# Patient Record
Sex: Male | Born: 1960 | Race: White | Hispanic: No | Marital: Single | Smoking: Never smoker
Health system: Southern US, Community
[De-identification: ages and names within clinical notes are randomized; demographics above are authoritative.]

## PROBLEM LIST (undated history)

## (undated) DIAGNOSIS — S36119A Unspecified injury of liver, initial encounter: Secondary | ICD-10-CM

## (undated) HISTORY — PX: CYST REMOVAL TRUNK: SHX6283

## (undated) HISTORY — PX: OTHER SURGICAL HISTORY: SHX169

---
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
## 2008-03-01 ENCOUNTER — Encounter: Payer: Self-pay | Admitting: Family Medicine

## 2009-03-22 ENCOUNTER — Telehealth (INDEPENDENT_AMBULATORY_CARE_PROVIDER_SITE_OTHER): Payer: Self-pay | Admitting: *Deleted

## 2009-03-22 ENCOUNTER — Ambulatory Visit: Payer: Self-pay | Admitting: Family Medicine

## 2009-03-22 DIAGNOSIS — F17200 Nicotine dependence, unspecified, uncomplicated: Secondary | ICD-10-CM | POA: Insufficient documentation

## 2009-03-22 DIAGNOSIS — R42 Dizziness and giddiness: Secondary | ICD-10-CM | POA: Insufficient documentation

## 2009-03-22 DIAGNOSIS — E785 Hyperlipidemia, unspecified: Secondary | ICD-10-CM | POA: Insufficient documentation

## 2009-03-22 DIAGNOSIS — K219 Gastro-esophageal reflux disease without esophagitis: Secondary | ICD-10-CM | POA: Insufficient documentation

## 2009-03-22 DIAGNOSIS — R21 Rash and other nonspecific skin eruption: Secondary | ICD-10-CM | POA: Insufficient documentation

## 2009-03-27 LAB — CONVERTED CEMR LAB
ALT: 22 units/L (ref 0–53)
AST: 28 units/L (ref 0–37)
Albumin: 4.5 g/dL (ref 3.5–5.2)
Alkaline Phosphatase: 58 units/L (ref 39–117)
BUN: 16 mg/dL (ref 6–23)
Basophils Relative: 0.5 % (ref 0.0–3.0)
CO2: 31 meq/L (ref 19–32)
Eosinophils Absolute: 0 10*3/uL (ref 0.0–0.7)
Glucose, Bld: 84 mg/dL (ref 70–99)
HCT: 45.2 % (ref 39.0–52.0)
Hemoglobin: 15.2 g/dL (ref 13.0–17.0)
Lymphocytes Relative: 23.8 % (ref 12.0–46.0)
Lymphs Abs: 1.7 10*3/uL (ref 0.7–4.0)
MCHC: 33.7 g/dL (ref 30.0–36.0)
MCV: 91.6 fL (ref 78.0–100.0)
Neutro Abs: 4.8 10*3/uL (ref 1.4–7.7)
Potassium: 4.7 meq/L (ref 3.5–5.1)
RBC: 4.93 M/uL (ref 4.22–5.81)
Sodium: 141 meq/L (ref 135–145)
Total Protein: 8 g/dL (ref 6.0–8.3)
VLDL: 17 mg/dL (ref 0.0–40.0)

## 2014-04-03 ENCOUNTER — Ambulatory Visit: Payer: Self-pay | Attending: Internal Medicine

## 2014-04-20 ENCOUNTER — Encounter: Payer: Self-pay | Admitting: Internal Medicine

## 2014-04-20 ENCOUNTER — Ambulatory Visit: Payer: Self-pay | Attending: Internal Medicine | Admitting: Internal Medicine

## 2014-04-20 VITALS — BP 128/78 | HR 95 | Temp 98.0°F | Resp 16 | Wt 199.6 lb

## 2014-04-20 DIAGNOSIS — K219 Gastro-esophageal reflux disease without esophagitis: Secondary | ICD-10-CM | POA: Insufficient documentation

## 2014-04-20 DIAGNOSIS — M25519 Pain in unspecified shoulder: Secondary | ICD-10-CM | POA: Insufficient documentation

## 2014-04-20 DIAGNOSIS — Z139 Encounter for screening, unspecified: Secondary | ICD-10-CM

## 2014-04-20 DIAGNOSIS — M25512 Pain in left shoulder: Secondary | ICD-10-CM | POA: Insufficient documentation

## 2014-04-20 DIAGNOSIS — K429 Umbilical hernia without obstruction or gangrene: Secondary | ICD-10-CM | POA: Insufficient documentation

## 2014-04-20 LAB — CBC WITH DIFFERENTIAL/PLATELET
BASOS PCT: 0 % (ref 0–1)
Basophils Absolute: 0 10*3/uL (ref 0.0–0.1)
EOS ABS: 0.1 10*3/uL (ref 0.0–0.7)
Eosinophils Relative: 2 % (ref 0–5)
HCT: 43.8 % (ref 39.0–52.0)
HEMOGLOBIN: 15.7 g/dL (ref 13.0–17.0)
Lymphocytes Relative: 27 % (ref 12–46)
Lymphs Abs: 2 10*3/uL (ref 0.7–4.0)
MCH: 30 pg (ref 26.0–34.0)
MCHC: 35.8 g/dL (ref 30.0–36.0)
MCV: 83.6 fL (ref 78.0–100.0)
MONOS PCT: 9 % (ref 3–12)
Monocytes Absolute: 0.7 10*3/uL (ref 0.1–1.0)
NEUTROS ABS: 4.5 10*3/uL (ref 1.7–7.7)
NEUTROS PCT: 62 % (ref 43–77)
PLATELETS: 300 10*3/uL (ref 150–400)
RBC: 5.24 MIL/uL (ref 4.22–5.81)
RDW: 14.2 % (ref 11.5–15.5)
WBC: 7.3 10*3/uL (ref 4.0–10.5)

## 2014-04-20 MED ORDER — OMEPRAZOLE 40 MG PO CPDR: 40.0000 mg | DELAYED_RELEASE_CAPSULE | Freq: Every day | ORAL | Status: DC

## 2014-04-20 MED ORDER — IBUPROFEN 600 MG PO TABS: 600.0000 mg | ORAL_TABLET | Freq: Three times a day (TID) | ORAL | Status: DC | PRN

## 2014-04-20 MED ORDER — TRAMADOL HCL 50 MG PO TABS: 50.0000 mg | ORAL_TABLET | Freq: Three times a day (TID) | ORAL | Status: DC | PRN

## 2014-04-20 NOTE — Progress Notes (Signed)
Patient Demographics  Jeffrey [name], is a 53 y.o. male  JYN:[id number]:[id number]  QMV:[id number]  DOB - 02/07/1961  CC:  Chief Complaint  Patient presents with  . Establish Care       HPI: Jeffrey [name] is a 53 y.o. male here today to establish medical care. Patient reported to have left shoulder pain for the last 6 months, denies any fall or trauma as per patient he was stretching and then he felt a popping sound, patient has been taking over-the-counter pain medication has difficulty in moving his left arm above the shoulder level, he also reported to have a umbilical hernia for several years, as per patient it slightly increased in size and wants to be fixed, denies any nausea vomiting reported to have GERD symptoms and he tried over-the-counter comes patient does drink alcohol. Patient has No headache, No chest pain, No abdominal pain - No Nausea, No new weakness tingling or numbness, No Cough - SOB.  No Known Allergies History reviewed. No pertinent past medical history. No current outpatient prescriptions on file prior to visit.   No current facility-administered medications on file prior to visit.   History reviewed. No pertinent family history. History   Social History  . Marital Status: Single    Spouse Name: N/A    Number of Children: N/A  . Years of Education: N/A   Occupational History  . Not on file.   Social History Main Topics  . Smoking status: Never Smoker   . Smokeless tobacco: Not on file  . Alcohol Use: Yes     Comment: occasioonally   . Drug Use: No     Comment: last use was 20 years ago   . Sexual Activity: Not on file   Other Topics Concern  . Not on file   Social History Narrative  . No narrative on file    Review of Systems: Constitutional: Negative for fever, chills, diaphoresis, activity change, appetite change and fatigue. HENT: Negative for ear pain, nosebleeds, congestion, facial swelling, rhinorrhea, neck pain, neck stiffness and ear  discharge.  Eyes: Negative for pain, discharge, redness, itching and visual disturbance. Respiratory: Negative for cough, choking, chest tightness, shortness of breath, wheezing and stridor.  Cardiovascular: Negative for chest pain, palpitations and leg swelling. Gastrointestinal: Negative for abdominal distention. Genitourinary: Negative for dysuria, urgency, frequency, hematuria, flank pain, decreased urine volume, difficulty urinating and dyspareunia.  Musculoskeletal: Negative for back pain, joint swelling, arthralgia and gait problem. Neurological: Negative for dizziness, tremors, seizures, syncope, facial asymmetry, speech difficulty, weakness, light-headedness, numbness and headaches.  Hematological: Negative for adenopathy. Does not bruise/bleed easily. Psychiatric/Behavioral: Negative for hallucinations, behavioral problems, confusion, dysphoric mood, decreased concentration and agitation.    Objective:   Filed Vitals:   04/20/14 1544  BP: 128/78  Pulse: 95  Temp: 98 F (36.7 C)  Resp: 16    Physical Exam: Constitutional: Patient appears well-developed and well-nourished. No distress. HENT: Normocephalic, atraumatic, External right and left ear normal. Oropharynx is clear and moist.  Eyes: Conjunctivae and EOM are normal. PERRLA, no scleral icterus. Neck: Normal ROM. Neck supple. No JVD. No tracheal deviation. No thyromegaly. CVS: RRR, S1/S2 +, no murmurs, no gallops, no carotid bruit.  Pulmonary: Effort and breath sounds normal, no stridor, rhonchi, wheezes, rales.  Abdominal: Soft. BS +, no distension, tenderness, rebound or guarding. : Umbilical hernia.  Musculoskeletal: Left shoulder limitation in full range of motion, patient cannot abduct fully.  axillary Neuro: Alert. Normal reflexes, muscle tone coordination. No  cranial nerve deficit. Skin: Skin is warm and dry. No rash noted. Not diaphoretic. No erythema. No pallor. Psychiatric: Normal mood and affect. Behavior,  judgment, thought content normal.  Lab Results  Component Value Date   WBC 7.1 03/22/2009   HGB 15.2 03/22/2009   HCT 45.2 03/22/2009   MCV 91.6 03/22/2009   PLT 256.0 03/22/2009   Lab Results  Component Value Date   CREATININE 0.8 03/22/2009   BUN 16 03/22/2009   NA 141 03/22/2009   K 4.7 03/22/2009   CL 104 03/22/2009   CO2 31 03/22/2009    No results found for this basename: HGBA1C   Lipid Panel     Component Value Date/Time   CHOL 266* 03/22/2009 1050   TRIG 85.0 03/22/2009 1050   HDL 46.00 03/22/2009 1050   CHOLHDL 6 03/22/2009 1050   VLDL 17.0 03/22/2009 1050       Assessment and plan:   1. Umbilical hernia  - Ambulatory referral to General Surgery  2. Screening Ordered baseline blood work - CBC with Differential - COMPLETE METABOLIC PANEL WITH GFR - TSH - Lipid panel - Vit D  25 hydroxy (rtn osteoporosis monitoring)  3. GERD (gastroesophageal reflux disease) Advised patient for lifestyle modification, trial of Prilosec - omeprazole (PRILOSEC) 40 MG capsule; Take 1 capsule (40 mg total) by mouth daily.  Dispense: 30 capsule; Refill: 3  4. Left shoulder pain Will get MRI to evaluate for soft tissue injury. Prescribed ibuprofen and tramadol for severe pain. - MR Shoulder Left Wo Contrast; Future - traMADol (ULTRAM) 50 MG tablet; Take 1 tablet (50 mg total) by mouth every 8 (eight) hours as needed for moderate pain.  Dispense: 30 tablet; Refill: 0 - ibuprofen (ADVIL,MOTRIN) 600 MG tablet; Take 1 tablet (600 mg total) by mouth every 8 (eight) hours as needed.  Dispense: 60 tablet; Refill: 1  Return in about 3 months (around 07/21/2014) for shoulder pain.  Doris CheadleADVANI, Gentri Guardado, MD

## 2014-04-20 NOTE — Progress Notes (Signed)
Patient here to establish care Complains of bumps on bottom of his feet Scoliosis Hernia Left rotator cuff injury

## 2014-04-21 LAB — TSH: TSH: 1.62 u[IU]/mL (ref 0.350–4.500)

## 2014-04-21 LAB — COMPLETE METABOLIC PANEL WITH GFR
ALBUMIN: 4.7 g/dL (ref 3.5–5.2)
ALK PHOS: 63 U/L (ref 39–117)
ALT: 15 U/L (ref 0–53)
AST: 18 U/L (ref 0–37)
BILIRUBIN TOTAL: 0.6 mg/dL (ref 0.2–1.2)
BUN: 10 mg/dL (ref 6–23)
CO2: 26 mEq/L (ref 19–32)
Calcium: 9.5 mg/dL (ref 8.4–10.5)
Chloride: 102 mEq/L (ref 96–112)
Creat: 1.02 mg/dL (ref 0.50–1.35)
GFR, Est African American: >89 mL/min
GFR, Est Non African American: 85 mL/min
GLUCOSE: 84 mg/dL (ref 70–99)
POTASSIUM: 4.4 meq/L (ref 3.5–5.3)
SODIUM: 139 meq/L (ref 135–145)
TOTAL PROTEIN: 7.8 g/dL (ref 6.0–8.3)

## 2014-04-21 LAB — LIPID PANEL
CHOL/HDL RATIO: 7.7 ratio
Cholesterol: 294 mg/dL — ABNORMAL HIGH (ref 0–200)
HDL: 38 mg/dL — AB (ref >39)
LDL CALC: 209 mg/dL — AB (ref 0–99)
TRIGLYCERIDES: 234 mg/dL — AB (ref <150)
VLDL: 47 mg/dL — AB (ref 0–40)

## 2014-04-21 LAB — VITAMIN D 25 HYDROXY (VIT D DEFICIENCY, FRACTURES): VIT D 25 HYDROXY: 35 ng/mL (ref 30–89)

## 2014-04-23 ENCOUNTER — Telehealth: Payer: Self-pay

## 2014-04-23 MED ORDER — ATORVASTATIN CALCIUM 20 MG PO TABS: 20.0000 mg | ORAL_TABLET | Freq: Every day | ORAL | Status: DC

## 2014-04-23 NOTE — Telephone Encounter (Signed)
Patient not available Left message on voice mail  To return our call

## 2014-04-23 NOTE — Telephone Encounter (Signed)
Message copied by Lestine MountJUAREZ, Roselia Snipe L on Mon Apr 23, 2014  2:12 PM ------      Message from: Doris CheadleADVANI, DEEPAK      Created: Mon Apr 23, 2014  9:17 AM       Blood work reviewed, noticed high cholesterol level including bad cholesterol ( LDL is more than 200), advise patient for low fat diet and start taking Lipitor 20 mg daily, will repeat lipid panel in 3 months. ------

## 2014-05-14 ENCOUNTER — Telehealth: Payer: Self-pay | Admitting: Internal Medicine

## 2014-05-14 ENCOUNTER — Ambulatory Visit (HOSPITAL_COMMUNITY)
Admission: RE | Admit: 2014-05-14 | Discharge: 2014-05-14 | Disposition: A | Discharge status: Home / Self Care | Payer: Self-pay | Source: Ambulatory Visit | Attending: Internal Medicine | Admitting: Internal Medicine

## 2014-05-14 DIAGNOSIS — M75100 Unspecified rotator cuff tear or rupture of unspecified shoulder, not specified as traumatic: Secondary | ICD-10-CM | POA: Insufficient documentation

## 2014-05-14 DIAGNOSIS — M25512 Pain in left shoulder: Secondary | ICD-10-CM

## 2014-05-14 DIAGNOSIS — IMO0002 Reserved for concepts with insufficient information to code with codable children: Secondary | ICD-10-CM | POA: Insufficient documentation

## 2014-05-14 NOTE — Telephone Encounter (Signed)
Pt. Needs to speak to nurse in regards to blood work, patient was informed that medication was ready for him to pick up, but he would rather talk to a nurse.

## 2014-05-15 ENCOUNTER — Telehealth: Payer: Self-pay

## 2014-05-15 NOTE — Telephone Encounter (Signed)
Patient not available Left message on voice mail to return our call 

## 2014-05-15 NOTE — Telephone Encounter (Signed)
Message copied by Lestine MountJUAREZ, Lachandra Dettmann L on Tue May 15, 2014  2:33 PM ------      Message from: Doris CheadleADVANI, DEEPAK      Created: Mon May 14, 2014  3:40 PM       Call and let the patient know that his MRI left shoulder reported rotator cuff tendinosis without tear, AC joint mild DJD/bursitis, patient needs to see sports medicine for further management. Please put in the referral. ------

## 2014-05-15 NOTE — Telephone Encounter (Signed)
Returned patient phone call Patient not available Left message on voice mail to return our call 

## 2014-05-17 ENCOUNTER — Telehealth: Payer: Self-pay | Admitting: *Deleted

## 2014-05-17 DIAGNOSIS — M25512 Pain in left shoulder: Secondary | ICD-10-CM

## 2014-05-17 NOTE — Telephone Encounter (Signed)
Patient called regarding results of MRI. Results of MRI and previous lab worked discussed at length. Patient advised on healthy diet and exercise to lower cholesterol. Patient states understanding. Also advised to start Lipitor as directed. Patient states he will pick up Lipitor and f/u in 3 months as instructed. Referral to sports med placed

## 2015-04-29 ENCOUNTER — Encounter (HOSPITAL_COMMUNITY): Payer: Self-pay | Admitting: Emergency Medicine

## 2015-04-29 ENCOUNTER — Emergency Department (HOSPITAL_COMMUNITY)
Admission: EM | Admit: 2015-04-29 | Discharge: 2015-04-29 | Disposition: A | Discharge status: Home / Self Care | Payer: Self-pay | Attending: Emergency Medicine | Admitting: Emergency Medicine

## 2015-04-29 DIAGNOSIS — Z8781 Personal history of (healed) traumatic fracture: Secondary | ICD-10-CM | POA: Insufficient documentation

## 2015-04-29 DIAGNOSIS — R61 Generalized hyperhidrosis: Secondary | ICD-10-CM | POA: Insufficient documentation

## 2015-04-29 DIAGNOSIS — X58XXXA Exposure to other specified factors, initial encounter: Secondary | ICD-10-CM | POA: Insufficient documentation

## 2015-04-29 DIAGNOSIS — E860 Dehydration: Secondary | ICD-10-CM | POA: Insufficient documentation

## 2015-04-29 DIAGNOSIS — R55 Syncope and collapse: Secondary | ICD-10-CM | POA: Insufficient documentation

## 2015-04-29 DIAGNOSIS — Z79899 Other long term (current) drug therapy: Secondary | ICD-10-CM | POA: Insufficient documentation

## 2015-04-29 DIAGNOSIS — R42 Dizziness and giddiness: Secondary | ICD-10-CM | POA: Insufficient documentation

## 2015-04-29 DIAGNOSIS — Y9353 Activity, golf: Secondary | ICD-10-CM | POA: Insufficient documentation

## 2015-04-29 DIAGNOSIS — S0101XA Laceration without foreign body of scalp, initial encounter: Secondary | ICD-10-CM | POA: Insufficient documentation

## 2015-04-29 DIAGNOSIS — S0003XA Contusion of scalp, initial encounter: Secondary | ICD-10-CM

## 2015-04-29 DIAGNOSIS — Y9229 Other specified public building as the place of occurrence of the external cause: Secondary | ICD-10-CM | POA: Insufficient documentation

## 2015-04-29 DIAGNOSIS — Y998 Other external cause status: Secondary | ICD-10-CM | POA: Insufficient documentation

## 2015-04-29 LAB — COMPREHENSIVE METABOLIC PANEL
ALBUMIN: 3.6 g/dL (ref 3.5–5.0)
ALK PHOS: 62 U/L (ref 38–126)
ALT: 35 U/L (ref 17–63)
AST: 35 U/L (ref 15–41)
Anion gap: 12 (ref 5–15)
BUN: 10 mg/dL (ref 6–20)
CO2: 20 mmol/L — ABNORMAL LOW (ref 22–32)
Calcium: 8.4 mg/dL — ABNORMAL LOW (ref 8.9–10.3)
Chloride: 108 mmol/L (ref 101–111)
Creatinine, Ser: 1.3 mg/dL — ABNORMAL HIGH (ref 0.61–1.24)
GFR calc Af Amer: >60 mL/min (ref >60)
GFR calc non Af Amer: >60 mL/min (ref >60)
GLUCOSE: 94 mg/dL (ref 65–99)
POTASSIUM: 3.2 mmol/L — AB (ref 3.5–5.1)
Sodium: 140 mmol/L (ref 135–145)
TOTAL PROTEIN: 6.8 g/dL (ref 6.5–8.1)
Total Bilirubin: 0.6 mg/dL (ref 0.3–1.2)

## 2015-04-29 LAB — CBC
HCT: 41.1 % (ref 39.0–52.0)
HEMOGLOBIN: 14.5 g/dL (ref 13.0–17.0)
MCH: 30.3 pg (ref 26.0–34.0)
MCHC: 35.3 g/dL (ref 30.0–36.0)
MCV: 86 fL (ref 78.0–100.0)
Platelets: 233 10*3/uL (ref 150–400)
RBC: 4.78 MIL/uL (ref 4.22–5.81)
RDW: 12.2 % (ref 11.5–15.5)
WBC: 7.6 10*3/uL (ref 4.0–10.5)

## 2015-04-29 LAB — I-STAT TROPONIN, ED: TROPONIN I, POC: 0 ng/mL (ref 0.00–0.08)

## 2015-04-29 LAB — CBG MONITORING, ED: Glucose-Capillary: 86 mg/dL (ref 65–99)

## 2015-04-29 LAB — ETHANOL: Alcohol, Ethyl (B): 15 mg/dL — ABNORMAL HIGH (ref <5)

## 2015-04-29 MED ORDER — SODIUM CHLORIDE 0.9 % IV BOLUS (SEPSIS)
1000.0000 mL | Freq: Once | INTRAVENOUS | Status: AC
  Administered 2015-04-29: 1000 mL via INTRAVENOUS

## 2015-04-29 NOTE — ED Notes (Signed)
Pt presents by EMS for syncopal episode that was [place] golf club[place]; pt reports round of golf and drinking etoh today without eating food; pt reports minimal PO hydration other than etoh; pt reports feeling dizzy and diaphoretic; witnesses report syncopal where pt fell and struck posterior head on lightly carpeted concrete floor; pt CAOx4 at this time; hematoma with bleeding noted to posterior head

## 2015-04-29 NOTE — ED Notes (Signed)
Pt ambulated in hallway without difficulty

## 2015-04-29 NOTE — ED Provider Notes (Signed)
CSN: [id number]     Arrival date & time 04/29/15  1943 History   First MD Initiated Contact with Patient 04/29/15 1957     Chief Complaint  Patient presents with  . Loss of Consciousness  . Head Injury    (Consider location/radiation/quality/duration/timing/severity/associated sxs/prior Treatment) HPI Comments: 54 year old male with no significant past medical history presents to the emergency department for further evaluation of a syncopal event. Patient reports that he had a [facility] for breakfast and 3 beers while golfing 9 holes today. He denies eating any food since waking. He was out in the sun for the majority of the day. He states that he began to feel lightheaded when he completed his round of golf. He went to the car and turned it on too cool himself with the [facility] unit. Patient reports that he next wanted to go inside the golf club house as he thought this would help him more than sitting in his car. He states that he remembers reaching for the door and, the next thing he knew, he was on the floor being helped by 2 individuals. Patient denies any chest pain or shortness of breath currently or prior to his syncopal event. He further denies headache, vision loss, hearing changes, nausea, vomiting, or extremity numbness/weakness. Patient cannot recall the date of his last tetanus shot. He states, "I want y'all to just tell me that I'm fine and can go home now".  Patient is a 54 y.o. male presenting with syncope and head injury. The history is provided by the patient. No language interpreter was used.  Loss of Consciousness Associated symptoms: diaphoresis   Associated symptoms: no chest pain, no fever, no headaches, no nausea, no shortness of breath and no vomiting   Head Injury Associated symptoms: no headaches, no nausea and no vomiting     History reviewed. No pertinent past medical history. Past Surgical History  Procedure Laterality Date  . Collar bone fracture     History  reviewed. No pertinent family history. History  Substance Use Topics  . Smoking status: Never Smoker   . Smokeless tobacco: Not on file  . Alcohol Use: Yes     Comment: occasioonally     Review of Systems  Constitutional: Positive for diaphoresis. Negative for fever.  Respiratory: Negative for shortness of breath.   Cardiovascular: Positive for syncope. Negative for chest pain.  Gastrointestinal: Negative for nausea and vomiting.  Neurological: Positive for syncope and light-headedness. Negative for headaches.  All other systems reviewed and are negative.   Allergies  Review of patient's allergies indicates no known allergies.  Home Medications   Prior to Admission medications   Medication Sig Start Date End Date Taking? Authorizing Provider  atorvastatin (LIPITOR) 20 MG tablet Take 1 tablet (20 mg total) by mouth daily. 04/23/14   Doris Cheadle, MD  ibuprofen (ADVIL,MOTRIN) 600 MG tablet Take 1 tablet (600 mg total) by mouth every 8 (eight) hours as needed. 04/20/14   Doris Cheadle, MD  omeprazole (PRILOSEC) 40 MG capsule Take 1 capsule (40 mg total) by mouth daily. 04/20/14   Doris Cheadle, MD  traMADol (ULTRAM) 50 MG tablet Take 1 tablet (50 mg total) by mouth every 8 (eight) hours as needed for moderate pain. 04/20/14   Doris Cheadle, MD   BP 124/94 mmHg  Pulse 78  Temp(Src) 97.6 F (36.4 C) (Oral)  Resp 19  Ht 5\' 11"  (1.803 m)  Wt 185 lb (83.915 kg)  BMI 25.81 kg/m2  SpO2 100%  Physical Exam  Constitutional: He is oriented to person, place, and time. He appears well-developed and well-nourished. No distress.  Nontoxic/nonseptic appearing.  HENT:  Head: Normocephalic.  Mouth/Throat: Oropharynx is clear and moist. No oropharyngeal exudate.  1 cm laceration and hematoma to posterior scalp. No Battle sign or raccoons eyes. No skull and stability.  Eyes: Conjunctivae and EOM are normal. Pupils are equal, round, and reactive to light. No scleral icterus.  Normal EOMs. No  nystagmus.  Neck: Normal range of motion.  Cardiovascular: Normal rate, regular rhythm and intact distal pulses.   Pulmonary/Chest: Effort normal and breath sounds normal. No respiratory distress. He has no wheezes. He has no rales.  Respirations even and unlabored. Chest expansion symmetric  Musculoskeletal: Normal range of motion.  Neurological: He is alert and oriented to person, place, and time. No cranial nerve deficit. He exhibits normal muscle tone. Coordination normal.  GCS 15. Speech is goal oriented. No focal neurologic deficits appreciated. Normal strength and sensation in all extremities. Patient moves extremities without ataxia.  Skin: Skin is warm and dry. No rash noted. He is not diaphoretic. No erythema. No pallor.  Psychiatric: He has a normal mood and affect. His behavior is normal.  Nursing note and vitals reviewed.   ED Course  Procedures (including critical care time) Labs Review Labs Reviewed  COMPREHENSIVE METABOLIC PANEL - Abnormal; Notable for the following:    Potassium 3.2 (*)    CO2 20 (*)    Creatinine, Ser 1.30 (*)    Calcium 8.4 (*)    All other components within normal limits  CBC  ETHANOL  CBG MONITORING, ED  I-STAT TROPOININ, ED   Imaging Review No results found.   EKG Interpretation   Date/Time:  Monday April 29 2015 19:49:31 EDT Ventricular Rate:  85 PR Interval:  199 QRS Duration: 102 QT Interval:  386 QTC Calculation: 459 R Axis:   -8 Text Interpretation:  Sinus rhythm Abnormal R-wave progression, early  transition Otherwise normal ECG No old tracing to compare Confirmed by  GOLDSTON  MD, SCOTT ([id number]) on 04/29/2015 7:59:06 PM      2010 - Patient declines suturing of laceration to posterior scalp and declines Tdap. He states that he understands the risks of not suturing his wound, including but not limited to infection or scarring, as well as the risks of not updating his tetanus, including but not limited to worsening outcomes or  death. Patient has the capacity to make these decisions.  MDM   Final diagnoses:  Syncope, unspecified syncope type  Hematoma of scalp, initial encounter  Laceration of scalp, initial encounter  Dehydration    54 year old male presents to the emergency department for further evaluation of syncope. Patient reports drinking 3 beers prior to arrival and a male in do after waking. He has had no food to eat throughout the day and was golfing in a heat for the majority of the afternoon. Laboratory workup is again for elevated creatinine function which is appreciated to be secondary to dehydration. It is likely that the cause of patient's syncope today was also because of dehydration. He has a nonfocal neurologic exam today. Patient hydrated in the emergency department with 2 L IV fluids. He is ambulatory in the hallway without difficulty.  Patient with laceration to his posterior scalp from the fall. He has no skull instability, battle sign, or raccoons eyes. Patient declines tetanus in the emergency department as well as any repair of his laceration with staples. He states that  he is ready to leave the emergency department, stating, "I'm fine and am waiting for you to tell me I can go home". Patient does not desire any additional workup. Given his good response to treatment, stable orthostatics, and adequate hydration in the ED, I believe discharge at this time is reasonable. Return precautions discussed and provided. Patient agreeable to plan with no unaddressed concerns. Patient discharged in good condition; VSS.   Filed Vitals:   04/29/15 1943 04/29/15 1950 04/29/15 1951  BP:   124/94  Pulse:  78   Temp:  97.6 F (36.4 C)   TempSrc:  Oral   Resp:  19   Height:   (1.803 m)   Weight:  185 lb (83.915 kg)   SpO2: 100% 100%      Antony Madura, PA-C 04/29/15 2308  Pricilla Loveless, MD 05/03/15 [phone]

## 2015-04-29 NOTE — Discharge Instructions (Signed)
Dehydration, Adult Dehydration is when you lose more fluids from the body than you take in. Vital organs like the kidneys, brain, and heart cannot function without a proper amount of fluids and salt. Any loss of fluids from the body can cause dehydration.  CAUSES   Vomiting.  Diarrhea.  Excessive sweating.  Excessive urine output.  Fever. SYMPTOMS  Mild dehydration  Thirst.  Dry lips.  Slightly dry mouth. Moderate dehydration  Very dry mouth.  Sunken eyes.  Skin does not bounce back quickly when lightly pinched and released.  Dark urine and decreased urine production.  Decreased tear production.  Headache. Severe dehydration  Very dry mouth.  Extreme thirst.  Rapid, weak pulse (more than 100 beats per minute at rest).  Cold hands and feet.  Not able to sweat in spite of heat and temperature.  Rapid breathing.  Blue lips.  Confusion and lethargy.  Difficulty being awakened.  Minimal urine production.  No tears. DIAGNOSIS  Your caregiver will diagnose dehydration based on your symptoms and your exam. Blood and urine tests will help confirm the diagnosis. The diagnostic evaluation should also identify the cause of dehydration. TREATMENT  Treatment of mild or moderate dehydration can often be done at home by increasing the amount of fluids that you drink. It is best to drink small amounts of fluid more often. Drinking too much at one time can make vomiting worse. Refer to the home care instructions below. Severe dehydration needs to be treated at the hospital where you will probably be given intravenous (IV) fluids that contain water and electrolytes. HOME CARE INSTRUCTIONS   Ask your caregiver about specific rehydration instructions.  Drink enough fluids to keep your urine clear or pale yellow.  Drink small amounts frequently if you have nausea and vomiting.  Eat as you normally do.  Avoid:  Foods or drinks high in sugar.  Carbonated  drinks.  Juice.  Extremely hot or cold fluids.  Drinks with caffeine.  Fatty, greasy foods.  Alcohol.  Tobacco.  Overeating.  Gelatin desserts.  Wash your hands well to avoid spreading bacteria and viruses.  Only take over-the-counter or prescription medicines for pain, discomfort, or fever as directed by your caregiver.  Ask your caregiver if you should continue all prescribed and over-the-counter medicines.  Keep all follow-up appointments with your caregiver. SEEK MEDICAL CARE IF:  You have abdominal pain and it increases or stays in one area (localizes).  You have a rash, stiff neck, or severe headache.  You are irritable, sleepy, or difficult to awaken.  You are weak, dizzy, or extremely thirsty. SEEK IMMEDIATE MEDICAL CARE IF:   You are unable to keep fluids down or you get worse despite treatment.  You have frequent episodes of vomiting or diarrhea.  You have blood or green matter (bile) in your vomit.  You have blood in your stool or your stool looks black and tarry.  You have not urinated in 6 to 8 hours, or you have only urinated a small amount of very dark urine.  You have a fever.  You faint. MAKE SURE YOU:   Understand these instructions.  Will watch your condition.  Will get help right away if you are not doing well or get worse. Document Released: 10/26/2005 Document Revised: 01/18/2012 Document Reviewed: 06/15/2011 [facility] Patient Information 2015 [place], [place]. This information is not intended to replace advice given to you by your health care provider. Make sure you discuss any questions you have with your health care  provider.  Laceration Care, Adult A laceration is a cut or lesion that goes through all layers of the skin and into the tissue just beneath the skin. TREATMENT  Some lacerations may not require closure. Some lacerations may not be able to be closed due to an increased risk of infection. It is important to see your  caregiver as soon as possible after an injury to minimize the risk of infection and maximize the opportunity for successful closure. If closure is appropriate, pain medicines may be given, if needed. The wound will be cleaned to help prevent infection. Your caregiver will use stitches (sutures), staples, wound glue (adhesive), or skin adhesive strips to repair the laceration. These tools bring the skin edges together to allow for faster healing and a better cosmetic outcome. However, all wounds will heal with a scar. Once the wound has healed, scarring can be minimized by covering the wound with sunscreen during the day for 1 full year. HOME CARE INSTRUCTIONS  For sutures or staples:  Keep the wound clean and dry.  If you were given a bandage (dressing), you should change it at least once a day. Also, change the dressing if it becomes wet or dirty, or as directed by your caregiver.  Wash the wound with soap and water 2 times a day. Rinse the wound off with water to remove all soap. Pat the wound dry with a clean towel.  After cleaning, apply a thin layer of the antibiotic ointment as recommended by your caregiver. This will help prevent infection and keep the dressing from sticking.  You may shower as usual after the first 24 hours. Do not soak the wound in water until the sutures are removed.  Only take over-the-counter or prescription medicines for pain, discomfort, or fever as directed by your caregiver.  Get your sutures or staples removed as directed by your caregiver. For skin adhesive strips:  Keep the wound clean and dry.  Do not get the skin adhesive strips wet. You may bathe carefully, using caution to keep the wound dry.  If the wound gets wet, pat it dry with a clean towel.  Skin adhesive strips will fall off on their own. You may trim the strips as the wound heals. Do not remove skin adhesive strips that are still stuck to the wound. They will fall off in time. For wound  adhesive:  You may briefly wet your wound in the shower or bath. Do not soak or scrub the wound. Do not swim. Avoid periods of heavy perspiration until the skin adhesive has fallen off on its own. After showering or bathing, gently pat the wound dry with a clean towel.  Do not apply liquid medicine, cream medicine, or ointment medicine to your wound while the skin adhesive is in place. This may loosen the film before your wound is healed.  If a dressing is placed over the wound, be careful not to apply tape directly over the skin adhesive. This may cause the adhesive to be pulled off before the wound is healed.  Avoid prolonged exposure to sunlight or tanning lamps while the skin adhesive is in place. Exposure to ultraviolet light in the first year will darken the scar.  The skin adhesive will usually remain in place for 5 to 10 days, then naturally fall off the skin. Do not pick at the adhesive film. You may need a tetanus shot if:  You cannot remember when you had your last tetanus shot.  You have never  had a tetanus shot. If you get a tetanus shot, your arm may swell, get red, and feel warm to the touch. This is common and not a problem. If you need a tetanus shot and you choose not to have one, there is a rare chance of getting tetanus. Sickness from tetanus can be serious. SEEK MEDICAL CARE IF:   You have redness, swelling, or increasing pain in the wound.  You see a red line that goes away from the wound.  You have yellowish-white fluid (pus) coming from the wound.  You have a fever.  You notice a bad smell coming from the wound or dressing.  Your wound breaks open before or after sutures have been removed.  You notice something coming out of the wound such as wood or glass.  Your wound is on your hand or foot and you cannot move a finger or toe. SEEK IMMEDIATE MEDICAL CARE IF:   Your pain is not controlled with prescribed medicine.  You have severe swelling around the  wound causing pain and numbness or a change in color in your arm, hand, leg, or foot.  Your wound splits open and starts bleeding.  You have worsening numbness, weakness, or loss of function of any joint around or beyond the wound.  You develop painful lumps near the wound or on the skin anywhere on your body. MAKE SURE YOU:   Understand these instructions.  Will watch your condition.  Will get help right away if you are not doing well or get worse. Document Released: 10/26/2005 Document Revised: 01/18/2012 Document Reviewed: 04/21/2011 [facility] Patient Information 2015 [place], [place]. This information is not intended to replace advice given to you by your health care provider. Make sure you discuss any questions you have with your health care provider.

## 2017-12-06 ENCOUNTER — Ambulatory Visit: Payer: 59 | Admitting: Family Medicine

## 2017-12-06 ENCOUNTER — Encounter: Payer: Self-pay | Admitting: Family Medicine

## 2017-12-06 VITALS — BP 120/82 | HR 103 | Ht 71.0 in | Wt 198.0 lb

## 2017-12-06 DIAGNOSIS — K429 Umbilical hernia without obstruction or gangrene: Secondary | ICD-10-CM

## 2017-12-06 DIAGNOSIS — R1030 Lower abdominal pain, unspecified: Secondary | ICD-10-CM | POA: Diagnosis not present

## 2017-12-06 DIAGNOSIS — K219 Gastro-esophageal reflux disease without esophagitis: Secondary | ICD-10-CM

## 2017-12-06 DIAGNOSIS — R197 Diarrhea, unspecified: Secondary | ICD-10-CM

## 2017-12-06 DIAGNOSIS — K4030 Unilateral inguinal hernia, with obstruction, without gangrene, not specified as recurrent: Secondary | ICD-10-CM

## 2017-12-06 DIAGNOSIS — R1110 Vomiting, unspecified: Secondary | ICD-10-CM

## 2017-12-06 LAB — POCT URINALYSIS DIP (PROADVANTAGE DEVICE)
BILIRUBIN UA: NEGATIVE mg/dL
Bilirubin, UA: NEGATIVE
Blood, UA: NEGATIVE
Glucose, UA: NEGATIVE mg/dL
LEUKOCYTES UA: NEGATIVE
Nitrite, UA: NEGATIVE
PH UA: 6 (ref 5.0–8.0)
PROTEIN UA: NEGATIVE mg/dL
Specific Gravity, Urine: 1.03
Urobilinogen, Ur: NEGATIVE

## 2017-12-06 NOTE — Progress Notes (Signed)
Subjective:    Patient ID: Jeffrey LeitzJeff [name], male    DOB: 08/24/1961, 57 y.o.   MRN: [id number]  HPI Chief Complaint  Patient presents with  . new pt    new pt get established. having groin pain.   He is new to the practice and here to establish care.  Previous medical care: no PCP  Last CPE: 2 years ago.   Complains of a 6 month history of intermittent sharp pain in his lower abdomen. Pain is worse with certain movements. Pain is not affected by eating, urination or bowel movements.   States 3 months ago he felt a "pop" in that general area and  3 weeks ago he noticed a bulge. Reports pain is worsening with lifting and movements.   States he vomited one night last week but thinks is was relate to drinking alcohol, chewing tobacco and eating spicy foods. Reports he immediately felt better after vomiting.  Reports having increased bowel movements over the past few weeks and he is having 2-3 bowel movements per day. States stools are looser and thinner than usual. He has never had a colonoscopy.  Denies fever, chills, dizziness, chest pain, palpitations, shortness of breath, urinary symptoms.    History of liver injury at age 57. Helmet to his ribs in football practice.     Numbness in his feet occasionally.   Other providers: none   Past medical history: umbilical hernia.   Social history: Lives alone. Has 1 child who is going to [employer]. works for the city in [employer]  Denies smoking, or drug use Chews tobacco. Drinks alcohol socially.   Health maintenance:  Colonoscopy: never  Last PSA: never Last Dental Exam: up to date.   Reviewed allergies, medications, past medical, surgical, family, and social history.   Review of Systems Pertinent positives and negatives in the history of present illness.     Objective:   Physical Exam  Constitutional: He is oriented to person, place, and time. He appears well-developed and well-nourished. No distress.  HENT:  Mouth/Throat:  Oropharynx is clear and moist.  Eyes: Conjunctivae are normal. Pupils are equal, round, and reactive to light.  Neck: Normal range of motion. Neck supple.  Cardiovascular: Normal rate, regular rhythm, normal heart sounds and intact distal pulses.  Pulmonary/Chest: Effort normal and breath sounds normal.  Abdominal: Soft. Bowel sounds are normal. There is tenderness. There is no rigidity, no rebound, no guarding, no CVA tenderness, no tenderness at McBurney's point and negative Murphy's sign. A hernia is present. Hernia confirmed positive in the left inguinal area.  Large bulge LLQ without skin changes or signs of herniation or strangulation.  Tender to area. Pain with bearing down or when doing a sit up.   Genitourinary: Testes normal and penis normal.     Musculoskeletal: Normal range of motion.  Lymphadenopathy:    He has no cervical adenopathy.  Neurological: He is alert and oriented to person, place, and time.  Skin: Skin is warm and dry. No erythema. No pallor.  Psychiatric: He has a normal mood and affect. His behavior is normal. Thought content normal.   BP 120/82   Pulse (!) 103   Ht 5\' 11"  (1.803 m)   Wt 198 lb (89.8 kg)   BMI 27.62 kg/m         Assessment & Plan:  Inguinal hernia of left side with obstruction and without gangrene - Plan: CBC with Differential/Platelet, Comprehensive metabolic panel, Ambulatory referral to General Surgery  Gastroesophageal reflux disease, esophagitis presence not specified  Umbilical hernia without obstruction and without gangrene - Plan: Ambulatory referral to General Surgery  Lower abdominal pain - Plan: POCT Urinalysis DIP (Proadvantage Device), CBC with Differential/Platelet, Comprehensive metabolic panel  Vomiting and diarrhea - Plan: POCT Urinalysis DIP (Proadvantage Device), CBC with Differential/Platelet, Comprehensive metabolic panel  Referral to surgery for large symptomatic left inguinal hernia.  Discussed red flags such  as uncontrolled pain or vomiting.  Made him aware of signs symptoms of obstruction and strangulation. Advised him to avoid lifting anything over 10 pounds until he sees the surgeon. He also has a asymptomatic umbilical hernia that he hopes can be repaired as well. Urinalysis dipstick is negative.   CBC, CMP ordered. Plan to have him return for a fasting CPE.  He is aware that he needs to have a colonoscopy especially since he has noticed recent changes in bowel habits.

## 2017-12-06 NOTE — Patient Instructions (Signed)
Do not lift over 10lbs until you see the surgeon. They will call you to schedule an appointment.  If you develop pain that does not resolve then you should go to the closest emergency department or call 911.   We will call you with your lab results.  Return for a fasting physical at your convenience as discussed.    Hernia A hernia happens when an organ or tissue inside your body pushes out through a weak spot in the belly (abdomen). Follow these instructions at home:  Avoid stretching or overusing (straining) the muscles near the hernia.  Do not lift anything heavier than 10 lb (4.5 kg).  Use the muscles in your leg when you lift something up. Do not use the muscles in your back.  When you cough, try to cough gently.  Eat a diet that has a lot of fiber. Eat lots of fruits and vegetables.  Drink enough fluids to keep your pee (urine) clear or pale yellow. Try to drink 6-8 glasses of water a day.  Take medicines to make your poop soft (stool softeners) as told by your doctor.  Lose weight, if you are overweight.  Do not use any tobacco products, including cigarettes, chewing tobacco, or electronic cigarettes. If you need help quitting, ask your doctor.  Keep all follow-up visits as told by your doctor. This is important. Contact a doctor if:  The skin by the hernia gets puffy (swollen) or red.  The hernia is painful. Get help right away if:  You have a fever.  You have belly pain that is getting worse.  You feel sick to your stomach (nauseous) or you throw up (vomit).  You cannot push the hernia back in place by gently pressing on it while you are lying down.  The hernia: ? Changes in shape or size. ? Is stuck outside your belly. ? Changes color. ? Feels hard or tender. This information is not intended to replace advice given to you by your health care provider. Make sure you discuss any questions you have with your health care provider. Document Released: 04/15/2010  Document Revised: 04/02/2016 Document Reviewed: 09/05/2014 Elsevier Interactive Patient Education  [employer]2018 [employer]

## 2017-12-07 LAB — CBC WITH DIFFERENTIAL/PLATELET
BASOS ABS: 0 10*3/uL (ref 0.0–0.2)
Basos: 0 %
EOS (ABSOLUTE): 0.1 10*3/uL (ref 0.0–0.4)
Eos: 1 %
Hematocrit: 45 % (ref 37.5–51.0)
Hemoglobin: 15.8 g/dL (ref 13.0–17.7)
Immature Grans (Abs): 0 10*3/uL (ref 0.0–0.1)
Immature Granulocytes: 0 %
LYMPHS ABS: 2 10*3/uL (ref 0.7–3.1)
Lymphs: 23 %
MCH: 30.5 pg (ref 26.6–33.0)
MCHC: 35.1 g/dL (ref 31.5–35.7)
MCV: 87 fL (ref 79–97)
MONOS ABS: 0.7 10*3/uL (ref 0.1–0.9)
Monocytes: 8 %
NEUTROS ABS: 5.8 10*3/uL (ref 1.4–7.0)
Neutrophils: 68 %
PLATELETS: 297 10*3/uL (ref 150–379)
RBC: 5.18 x10E6/uL (ref 4.14–5.80)
RDW: 13.1 % (ref 12.3–15.4)
WBC: 8.6 10*3/uL (ref 3.4–10.8)

## 2017-12-07 LAB — COMPREHENSIVE METABOLIC PANEL
ALK PHOS: 77 IU/L (ref 39–117)
ALT: 24 IU/L (ref 0–44)
AST: 24 IU/L (ref 0–40)
Albumin/Globulin Ratio: 1.5 (ref 1.2–2.2)
Albumin: 4.8 g/dL (ref 3.5–5.5)
BILIRUBIN TOTAL: 0.3 mg/dL (ref 0.0–1.2)
BUN/Creatinine Ratio: 14 (ref 9–20)
BUN: 14 mg/dL (ref 6–24)
CO2: 23 mmol/L (ref 20–29)
Calcium: 9.8 mg/dL (ref 8.7–10.2)
Chloride: 103 mmol/L (ref 96–106)
Creatinine, Ser: 0.98 mg/dL (ref 0.76–1.27)
GFR calc Af Amer: 101 mL/min/{1.73_m2} (ref >59)
GFR calc non Af Amer: 87 mL/min/{1.73_m2} (ref >59)
Globulin, Total: 3.3 g/dL (ref 1.5–4.5)
Glucose: 87 mg/dL (ref 65–99)
Potassium: 5.2 mmol/L (ref 3.5–5.2)
Sodium: 145 mmol/L — ABNORMAL HIGH (ref 134–144)
Total Protein: 8.1 g/dL (ref 6.0–8.5)

## 2017-12-09 ENCOUNTER — Telehealth: Payer: Self-pay | Admitting: Family Medicine

## 2017-12-09 NOTE — Telephone Encounter (Signed)
Pt called to get a status of referral to surgeon. He is in pain. Pt did mention that he will likely have to get this all approved through his job since this is work related.

## 2017-12-09 NOTE — Telephone Encounter (Signed)
Spoke to patient and advised him that we can not continue this referral to [facility] as this is worker's comp related and we do not deal worker's comp case. Pt will call back to his manager/HR and go forward with their protocol. I have called [facility] and canceled the appt   Vickie- please edit your office notes about being a worker's comp case as pt forgot to tell you that.

## 2017-12-24 ENCOUNTER — Encounter (HOSPITAL_COMMUNITY): Payer: Self-pay | Admitting: *Deleted

## 2017-12-24 ENCOUNTER — Emergency Department (HOSPITAL_COMMUNITY)
Admission: EM | Admit: 2017-12-24 | Discharge: 2017-12-24 | Disposition: A | Discharge status: Home / Self Care | Payer: Worker's Compensation | Attending: Emergency Medicine | Admitting: Emergency Medicine

## 2017-12-24 ENCOUNTER — Other Ambulatory Visit: Payer: Self-pay

## 2017-12-24 ENCOUNTER — Emergency Department (HOSPITAL_COMMUNITY): Payer: Worker's Compensation

## 2017-12-24 DIAGNOSIS — R110 Nausea: Secondary | ICD-10-CM | POA: Insufficient documentation

## 2017-12-24 DIAGNOSIS — K4090 Unilateral inguinal hernia, without obstruction or gangrene, not specified as recurrent: Secondary | ICD-10-CM | POA: Insufficient documentation

## 2017-12-24 LAB — CBC WITH DIFFERENTIAL/PLATELET
Basophils Absolute: 0 10*3/uL (ref 0.0–0.1)
Basophils Relative: 0 %
Eosinophils Absolute: 0.2 10*3/uL (ref 0.0–0.7)
Eosinophils Relative: 3 %
HEMATOCRIT: 46 % (ref 39.0–52.0)
Hemoglobin: 15.7 g/dL (ref 13.0–17.0)
LYMPHS ABS: 2.3 10*3/uL (ref 0.7–4.0)
LYMPHS PCT: 31 %
MCH: 31 pg (ref 26.0–34.0)
MCHC: 34.1 g/dL (ref 30.0–36.0)
MCV: 90.7 fL (ref 78.0–100.0)
MONO ABS: 0.5 10*3/uL (ref 0.1–1.0)
Monocytes Relative: 6 %
NEUTROS ABS: 4.4 10*3/uL (ref 1.7–7.7)
Neutrophils Relative %: 60 %
PLATELETS: 268 10*3/uL (ref 150–400)
RBC: 5.07 MIL/uL (ref 4.22–5.81)
RDW: 13.3 % (ref 11.5–15.5)
WBC: 7.4 10*3/uL (ref 4.0–10.5)

## 2017-12-24 LAB — COMPREHENSIVE METABOLIC PANEL
ALT: 24 U/L (ref 17–63)
AST: 30 U/L (ref 15–41)
Albumin: 4.2 g/dL (ref 3.5–5.0)
Alkaline Phosphatase: 67 U/L (ref 38–126)
Anion gap: 12 (ref 5–15)
BILIRUBIN TOTAL: 0.8 mg/dL (ref 0.3–1.2)
BUN: 10 mg/dL (ref 6–20)
CHLORIDE: 106 mmol/L (ref 101–111)
CO2: 23 mmol/L (ref 22–32)
Calcium: 9.4 mg/dL (ref 8.9–10.3)
Creatinine, Ser: 0.98 mg/dL (ref 0.61–1.24)
GFR calc non Af Amer: >60 mL/min (ref >60)
Glucose, Bld: 90 mg/dL (ref 65–99)
Potassium: 4.1 mmol/L (ref 3.5–5.1)
Sodium: 141 mmol/L (ref 135–145)
TOTAL PROTEIN: 7.7 g/dL (ref 6.5–8.1)

## 2017-12-24 LAB — LIPASE, BLOOD: Lipase: 29 U/L (ref 11–51)

## 2017-12-24 MED ORDER — SODIUM CHLORIDE 0.9 % IV BOLUS (SEPSIS)
1000.0000 mL | Freq: Once | INTRAVENOUS | Status: AC
  Administered 2017-12-24: 1000 mL via INTRAVENOUS

## 2017-12-24 MED ORDER — HYDROMORPHONE HCL 1 MG/ML IJ SOLN
1.0000 mg | Freq: Once | INTRAMUSCULAR | Status: AC
  Administered 2017-12-24: 1 mg via INTRAVENOUS
  Filled 2017-12-24: qty 1

## 2017-12-24 MED ORDER — HYDROCODONE-ACETAMINOPHEN 5-325 MG PO TABS: 2.0000 | ORAL_TABLET | Freq: Once | ORAL | Status: DC

## 2017-12-24 MED ORDER — IOPAMIDOL (ISOVUE-300) INJECTION 61%
INTRAVENOUS | Status: AC
  Administered 2017-12-24: 100 mL
  Filled 2017-12-24: qty 100

## 2017-12-24 MED ORDER — DOCUSATE SODIUM 250 MG PO CAPS: 250.0000 mg | ORAL_CAPSULE | Freq: Every day | ORAL | 0 refills | Status: AC

## 2017-12-24 MED ORDER — HYDROCODONE-ACETAMINOPHEN 5-325 MG PO TABS: 1.0000 | ORAL_TABLET | ORAL | 0 refills | Status: DC | PRN

## 2017-12-24 MED ORDER — ONDANSETRON HCL 4 MG/2ML IJ SOLN
4.0000 mg | Freq: Once | INTRAMUSCULAR | Status: AC
  Administered 2017-12-24: 4 mg via INTRAVENOUS
  Filled 2017-12-24: qty 2

## 2017-12-24 NOTE — ED Triage Notes (Signed)
Pt was lifting a generator today and felt his left groin hernia "pop out" pt is diaphoretic and tearful. Pt has had problems with this hernia and is waiting for surgeon to call back. Pain 10/10.

## 2017-12-24 NOTE — ED Notes (Signed)
Patient transported to CT 

## 2017-12-24 NOTE — ED Provider Notes (Signed)
[facility] EMERGENCY DEPARTMENT Provider Note   CSN: [id number] Arrival date & time: 12/24/17  1108     History   Chief Complaint Chief Complaint  Patient presents with  . Hernia    HPI Benigno Check is a 57 y.o. male.  HPI  57 year old male with history of umbilical as well as inguinal hernia here with severe hernia pain.  The patient states that he was diagnosed with an inguinal hernia several months ago.  Since then, he is been increasingly symptomatic.  He has been referred to a surgery but has not followed up.  He states that earlier today, while at work, he was bending down when he started lifting something heavy.  X-rays acute onset of severe, cramping, sharp, stabbing, left lower abdominal pain and felt a bulge to his hernia.  Since then he has had persistent pain and nausea.  No vomiting.  He has had no bowel movement or passing flatus today.  He subsequent presents for evaluation.  No fevers or chills.  Has never had incarceration.  Has never had any intra-abdominal surgeries.  No alleviating factors.  Pain is worse with movement and palpation, particularly with bearing down.  History reviewed. No pertinent past medical history.  Patient Active Problem List   Diagnosis Date Noted  . Umbilical hernia 04/20/2014  . GERD (gastroesophageal reflux disease) 04/20/2014  . Left shoulder pain 04/20/2014  . HYPERLIPIDEMIA 03/22/2009  . TOBACCO ABUSE 03/22/2009    Past Surgical History:  Procedure Laterality Date  . collar bone fracture         Home Medications    Prior to Admission medications   Medication Sig Start Date End Date Taking? Authorizing Provider  docusate sodium (COLACE) 250 MG capsule Take 1 capsule (250 mg total) by mouth daily. 12/24/17 01/23/18  Shaune Pollack, MD  HYDROcodone-acetaminophen (NORCO/VICODIN) 5-325 MG tablet Take 1-2 tablets by mouth every 4 (four) hours as needed for moderate pain or severe pain. 12/24/17   Shaune Pollack, MD    Family History No family history on file.  Social History Social History   Tobacco Use  . Smoking status: Never Smoker  . Smokeless tobacco: Never Used  Substance Use Topics  . Alcohol use: Yes    Comment: occasioonally   . Drug use: No    Comment: last use was 20 years ago      Allergies   Patient has no known allergies.   Review of Systems Review of Systems  Constitutional: Positive for fatigue. Negative for chills and fever.  HENT: Negative for congestion and rhinorrhea.   Eyes: Negative for visual disturbance.  Respiratory: Negative for cough, shortness of breath and wheezing.   Cardiovascular: Negative for chest pain and leg swelling.  Gastrointestinal: Positive for abdominal pain and nausea. Negative for diarrhea and vomiting.  Genitourinary: Negative for dysuria and flank pain.  Musculoskeletal: Negative for neck pain and neck stiffness.  Skin: Negative for rash and wound.  Allergic/Immunologic: Negative for immunocompromised state.  Neurological: Negative for syncope, weakness and headaches.  All other systems reviewed and are negative.    Physical Exam Updated Vital Signs BP 110/79   Pulse 61   Temp 98.3 F (36.8 C)   Resp 11   SpO2 100%   Physical Exam  Constitutional: He is oriented to person, place, and time. He appears well-developed and well-nourished. He appears distressed (Appears uncomfortable).  HENT:  Head: Normocephalic and atraumatic.  Eyes: Conjunctivae are normal.  Neck: Neck supple.  Cardiovascular: Normal rate, regular rhythm and normal heart sounds. Exam reveals no friction rub.  No murmur heard. Pulmonary/Chest: Effort normal and breath sounds normal. No respiratory distress. He has no wheezes. He has no rales.  Abdominal: Soft. He exhibits no distension. There is tenderness.  Exquisite tenderness over left inguinal canal.  There is palpable bowel in the inguinal canal but no overt swelling or asymmetry.  No  overlying erythema.  Difficulty appreciating anatomy due to pain.  Musculoskeletal: He exhibits no edema.  Neurological: He is alert and oriented to person, place, and time. He exhibits normal muscle tone.  Skin: Skin is warm. Capillary refill takes less than 2 seconds.  Psychiatric: He has a normal mood and affect.  Nursing note and vitals reviewed.    ED Treatments / Results  Labs (all labs ordered are listed, but only abnormal results are displayed) Labs Reviewed  CBC WITH DIFFERENTIAL/PLATELET  COMPREHENSIVE METABOLIC PANEL  LIPASE, BLOOD    EKG  EKG Interpretation None       Radiology Ct Abdomen Pelvis W Contrast  Result Date: 12/24/2017 CLINICAL DATA:  The patient was lifting heavy object and felt a pop in his left groin. EXAM: CT ABDOMEN AND PELVIS WITH CONTRAST TECHNIQUE: Multidetector CT imaging of the abdomen and pelvis was performed using the standard protocol following bolus administration of intravenous contrast. CONTRAST:  100mL ISOVUE-300 IOPAMIDOL (ISOVUE-300) INJECTION 61% COMPARISON:  None. FINDINGS: Lower chest: No acute abnormality. Hepatobiliary: Mild hepatic steatosis. No liver masses. The gallbladder and portal vein are normal. Pancreas: Subtle low-attenuation in the head of the pancreas is non mass like and appears to represent focal fatty deposition. No focal mass identified. Spleen: Normal in size without focal abnormality. Adrenals/Urinary Tract: Bilateral renal cysts are identified. No hydronephrosis, perinephric stranding, ureterectasis, or ureteral stone. The bladder is normal. Stomach/Bowel: The stomach and small bowel are normal. Scattered colonic diverticuli are identified without diverticulitis. The colon is otherwise normal. The appendix is normal. Vascular/Lymphatic: No significant vascular findings are present. No enlarged abdominal or pelvic lymph nodes. Reproductive: Prostate is unremarkable. Other: There is a periumbilical fat containing hernia.  There is a small left inguinal hernia. There is mild increased attenuation in the fat adjacent to the origin of the inguinal canal, asymmetric to the right, as seen on axial image 86 and coronal image 23. No muscular avulsion identified. No hematoma. Musculoskeletal: No acute or significant osseous findings. IMPRESSION: 1. There is a small left inguinal hernia which is may be chronic. There is slight increased attenuation of fat of the inguinal region, asymmetric on the left compared to the right. It is possible this is normal for this patient. However, this could be a subtle sign of soft tissue injury. No other significant abnormalities. Electronically Signed   By: Gerome Samavid  Williams III M.D   On: 12/24/2017 13:24    Procedures Procedures (including critical care time)  Medications Ordered in ED Medications  HYDROcodone-acetaminophen (NORCO/VICODIN) 5-325 MG per tablet 2 tablet (2 tablets Oral Refused 12/24/17 1425)  HYDROmorphone (DILAUDID) injection 1 mg (1 mg Intravenous Given 12/24/17 1208)  ondansetron (ZOFRAN) injection 4 mg (4 mg Intravenous Given 12/24/17 1206)  sodium chloride 0.9 % bolus 1,000 mL (0 mLs Intravenous Stopped 12/24/17 1337)  iopamidol (ISOVUE-300) 61 % injection (100 mLs  Contrast Given 12/24/17 1249)     Initial Impression / Assessment and Plan / ED Course  I have reviewed the triage vital signs and the nursing notes.  Pertinent labs & imaging results that were  available during my care of the patient were reviewed by me and considered in my medical decision making (see chart for details).    57 year old male with past medical history as above here with symptomatic left indirect inguinal hernia.  I am able to reduce it on exam here.  CT scan shows mild soft tissue swelling likely secondary to transient herniation that is now resolved.  He has no nausea, vomiting, or signs of ongoing obstruction.  His lab work is unremarkable.  CT scan otherwise negative.  Given that his symptoms  are resolved, will refer him for urgent outpatient surgical follow-up.  Discussed no heavy lifting or twisting.  Stool softener started.  Final Clinical Impressions(s) / ED Diagnoses   Final diagnoses:  Inguinal hernia, left    ED Discharge Orders        Ordered    HYDROcodone-acetaminophen (NORCO/VICODIN) 5-325 MG tablet  Every 4 hours PRN     12/24/17 1417    docusate sodium (COLACE) 250 MG capsule  Daily     12/24/17 1417       Shaune Pollack, MD 12/24/17 1524

## 2018-01-07 ENCOUNTER — Other Ambulatory Visit: Payer: Self-pay | Admitting: Surgery

## 2018-01-18 NOTE — Pre-Procedure Instructions (Signed)
Bonney LeitzJeff Floren  01/18/2018      [facility] - [place] - [address] [address] [address] [address] [address] [place] [id number] Phone: [phone] Fax: [phone]    Your procedure is scheduled on January 24, 2018.  [facility] AM.  Call this number if you have problems the morning of surgery:  [phone]   Remember:  Do not eat food or drink liquids after midnight.  Take these medicines the morning of surgery with A SIP OF WATER hydrocodone-acetaminophen (norco)-if needed for pain.  7 days prior to surgery STOP taking any Aspirin (unless otherwise instructed by your surgeon), Aleve, Naproxen, Ibuprofen, Motrin, Advil, Goody's, BC's, all herbal medications, fish oil, and all vitamins  Continue all other medications as instructed by your physician except follow the above medication instructions before surgery   Do not wear jewelry.  Do not wear lotions, powders, or colognes, or deodorant.  Men may shave face and neck.  Do not bring valuables to the hospital.  [facility] is not responsible for any belongings or valuables.  Contacts, dentures or bridgework may not be worn into surgery.  Leave your suitcase in the car.  After surgery it may be brought to your room.  For patients admitted to the hospital, discharge time will be determined by your treatment team.  Patients discharged the day of surgery will not be allowed to drive home.    [facility]- Preparing For Surgery  Before surgery, you can play an important role. Because skin is not sterile, your skin needs to be as free of germs as possible. You can reduce the number of germs on your skin by washing with CHG (chlorahexidine gluconate) Soap before surgery.  CHG is an antiseptic cleaner which kills germs and bonds with the skin to continue killing germs even after washing.  Please do not use if you have an allergy to CHG or antibacterial soaps. If  your skin becomes reddened/irritated stop using the CHG.  Do not shave (including legs and underarms) for at least 48 hours prior to first CHG shower. It is OK to shave your face.  Please follow these instructions carefully.   1. Shower the NIGHT BEFORE SURGERY and the MORNING OF SURGERY with CHG.   2. If you chose to wash your hair, wash your hair first as usual with your normal shampoo.  3. After you shampoo, rinse your hair and body thoroughly to remove the shampoo.  4. Use CHG as you would any other liquid soap. You can apply CHG directly to the skin and wash gently with a scrungie or a clean washcloth.   5. Apply the CHG Soap to your body ONLY FROM THE NECK DOWN.  Do not use on open wounds or open sores. Avoid contact with your eyes, ears, mouth and genitals (private parts). Wash Face and genitals (private parts)  with your normal soap.  6. Wash thoroughly, paying special attention to the area where your surgery will be performed.  7. Thoroughly rinse your body with warm water from the neck down.  8. DO NOT shower/wash with your normal soap after using and rinsing off the CHG Soap.  9. Pat yourself dry with a CLEAN TOWEL.  10. Wear CLEAN PAJAMAS to bed the night before surgery, wear comfortable clothes the morning of surgery  11. Place CLEAN SHEETS on your bed the night of your first shower and DO NOT SLEEP WITH PETS.  Day of Surgery: Do not apply any deodorants/lotions. Please wear clean clothes to the hospital/surgery center.    Please read over the following fact sheets that you were given. Pain Booklet, Coughing and Deep Breathing and Surgical Site Infection Prevention

## 2018-01-18 NOTE — Progress Notes (Addendum)
PCP: Hetty BlendVickie Henson, NP-C  Cardiologist: pt denies  EKG: pt denies past year, no cardiac history  Stress test: pt denies ever  ECHO: pt denies ever  Cardiac Cath: pt denies ever  Chest x-ray: pt denies past year, no recent respiratory infections/complications

## 2018-01-19 ENCOUNTER — Encounter (HOSPITAL_COMMUNITY)
Admission: RE | Admit: 2018-01-19 | Discharge: 2018-01-19 | Disposition: A | Discharge status: Home / Self Care | Payer: Worker's Compensation | Source: Ambulatory Visit | Attending: Orthopaedic Surgery | Admitting: Orthopaedic Surgery

## 2018-01-19 ENCOUNTER — Encounter (HOSPITAL_COMMUNITY): Payer: Self-pay

## 2018-01-19 ENCOUNTER — Other Ambulatory Visit: Payer: Self-pay

## 2018-01-19 DIAGNOSIS — Z01812 Encounter for preprocedural laboratory examination: Secondary | ICD-10-CM | POA: Diagnosis present

## 2018-01-19 DIAGNOSIS — K429 Umbilical hernia without obstruction or gangrene: Secondary | ICD-10-CM | POA: Diagnosis not present

## 2018-01-19 DIAGNOSIS — K4090 Unilateral inguinal hernia, without obstruction or gangrene, not specified as recurrent: Secondary | ICD-10-CM | POA: Diagnosis not present

## 2018-01-19 HISTORY — DX: Unspecified injury of liver, initial encounter: S36.119A

## 2018-01-19 LAB — CBC
HCT: 43.5 % (ref 39.0–52.0)
Hemoglobin: 14.8 g/dL (ref 13.0–17.0)
MCH: 30.7 pg (ref 26.0–34.0)
MCHC: 34 g/dL (ref 30.0–36.0)
MCV: 90.2 fL (ref 78.0–100.0)
PLATELETS: 237 10*3/uL (ref 150–400)
RBC: 4.82 MIL/uL (ref 4.22–5.81)
RDW: 12.7 % (ref 11.5–15.5)
WBC: 7.4 10*3/uL (ref 4.0–10.5)

## 2018-01-19 MED ORDER — CHLORHEXIDINE GLUCONATE CLOTH 2 % EX PADS: 6.0000 | MEDICATED_PAD | Freq: Once | CUTANEOUS | Status: DC

## 2018-01-23 NOTE — H&P (Signed)
  Jeffrey PartridgeJeffrey R Frieling Documented: 01/07/2018 10:56 AM Location: [facility] Patient #: [id number] DOB: 11/18/1960 Single / Language: [place] / Race: White Male   History of Present Illness Jeffrey [name](Jeffrey Zuver A. Magnus IvanBlackman MD; 01/07/2018 11:39 AM) The patient is a 57 year old male who presents for an evaluation of a hernia. This gentleman is sent by workers comp for evaluation of a symptomatic left inguinal hernia. The patient was doing heavy lifting at his work in November when he had the sudden onset of sharp pain in the left groin. He then has noticed a reducible bulge. He actually had increased pain several weeks ago so went to the emergency department and the hernia can be reduced. A CT scan confirmed a hernia as well. Today, he has only mild discomfort. He is on light duty at work. He has had no obstructive symptoms. He has had no previous surgery on his abdomen.   Allergies Jeffrey [name](Jeffrey [name], CMA; 01/07/2018 10:56 AM) No Known Drug Allergies [01/07/2018]:  Medication History (Jeffrey DevoidChemira [name], CMA; 01/07/2018 10:56 AM) No Current Medications Medications Reconciled  Vitals (Jeffrey [name] CMA; 01/07/2018 10:56 AM) 01/07/2018 10:56 AM Weight: 195 lb Height: 71in Body Surface Area: 2.09 m Body Mass Index: 27.2 kg/m  Pulse: 62 (Regular)  BP: 130/78 (Sitting, Left Arm, Standard)       Physical Exam (Jeffrey [name] A. Magnus IvanBlackman MD; 01/07/2018 11:40 AM) General Mental Status-Alert. General Appearance-Consistent with stated age. Hydration-Well hydrated. Voice-Normal.  Head and Neck Head-normocephalic, atraumatic with no lesions or palpable masses. Trachea-midline.  Eye Eyeball - Bilateral-Extraocular movements intact. Sclera/Conjunctiva - Bilateral-No scleral icterus.  Chest and Lung Exam Chest and lung exam reveals -quiet, even and easy respiratory effort with no use of accessory muscles and on auscultation, normal breath sounds, no adventitious sounds and normal  vocal resonance. Inspection Chest Wall - Normal. Back - normal.  Cardiovascular Cardiovascular examination reveals -normal heart sounds, regular rate and rhythm with no murmurs and normal pedal pulses bilaterally.  Abdomen Inspection Skin - Scar - no surgical scars. Hernias - Umbilical hernia - Reducible. Inguinal hernia - Left - Reducible. Palpation/Percussion Palpation and Percussion of the abdomen reveal - Soft, Non Tender, No Rebound tenderness, No Rigidity (guarding) and No hepatosplenomegaly. Auscultation Auscultation of the abdomen reveals - Bowel sounds normal.  Neurologic - Did not examine.  Musculoskeletal - Did not examine.    Assessment & Plan (Jeffrey [name] A. Magnus IvanBlackman MD; 01/07/2018 11:41 AM) LEFT INGUINAL HERNIA (K40.90) Impression: I discussed the diagnosis of hernias with the patient in detail. He has a moderate sized left inguinal hernia as well as an umbilical hernia. There is weakness in the right groin as well and I suspect he may be developing a right inguinal hernia. I discussed hernia repair. I discussed use of mesh. I discussed both the laparoscopic and open techniques. I would recommend a laparoscopic left inguinal hernia repair with mesh show I evaluate the right side and place mesh there is present as well. We will also repair the umbilical hernia at the same time with possible mesh. I discussed the risks which includes but is not limited to bleeding, infection, injury to surrounding structures, chronic pain, nerve entrapment, hernia recurrence, cardiopulmonary issues, postoperative recovery, etc. He understands and wishes to proceed with surgery which will be scheduled UMBILICAL HERNIA (K42.9)

## 2018-01-24 ENCOUNTER — Encounter (HOSPITAL_COMMUNITY)
Admission: RE | Disposition: A | Discharge status: Home / Self Care | Payer: Self-pay | Source: Ambulatory Visit | Attending: Surgery

## 2018-01-24 ENCOUNTER — Encounter (HOSPITAL_COMMUNITY): Payer: Self-pay | Admitting: Anesthesiology

## 2018-01-24 ENCOUNTER — Ambulatory Visit (HOSPITAL_COMMUNITY): Payer: Worker's Compensation

## 2018-01-24 ENCOUNTER — Ambulatory Visit (HOSPITAL_COMMUNITY)
Admission: RE | Admit: 2018-01-24 | Discharge: 2018-01-24 | Disposition: A | Discharge status: Home / Self Care | Payer: Worker's Compensation | Source: Ambulatory Visit | Attending: Surgery | Admitting: Surgery

## 2018-01-24 DIAGNOSIS — K4020 Bilateral inguinal hernia, without obstruction or gangrene, not specified as recurrent: Secondary | ICD-10-CM | POA: Insufficient documentation

## 2018-01-24 DIAGNOSIS — K429 Umbilical hernia without obstruction or gangrene: Secondary | ICD-10-CM | POA: Diagnosis not present

## 2018-01-24 DIAGNOSIS — K4090 Unilateral inguinal hernia, without obstruction or gangrene, not specified as recurrent: Secondary | ICD-10-CM | POA: Diagnosis present

## 2018-01-24 HISTORY — PX: LAPAROSCOPIC INGUINAL HERNIA WITH UMBILICAL HERNIA: SHX5658

## 2018-01-24 HISTORY — PX: INSERTION OF MESH: SHX5868

## 2018-01-24 SURGERY — LAPAROSCOPIC INGUINAL HERNIA WITH UMBILICAL HERNIA
Anesthesia: General | Site: Groin

## 2018-01-24 MED ORDER — LACTATED RINGERS IV SOLN
INTRAVENOUS | Status: DC
  Administered 2018-01-24: 50 mL/h via INTRAVENOUS

## 2018-01-24 MED ORDER — DEXAMETHASONE SODIUM PHOSPHATE 10 MG/ML IJ SOLN
INTRAMUSCULAR | Status: AC
  Filled 2018-01-24: qty 1

## 2018-01-24 MED ORDER — OXYCODONE HCL 5 MG PO TABS
5.0000 mg | ORAL_TABLET | Freq: Once | ORAL | Status: AC
  Administered 2018-01-24: 5 mg via ORAL

## 2018-01-24 MED ORDER — ACETAMINOPHEN 500 MG PO TABS
1000.0000 mg | ORAL_TABLET | ORAL | Status: AC
  Administered 2018-01-24: 1000 mg via ORAL
  Filled 2018-01-24: qty 2

## 2018-01-24 MED ORDER — CEFAZOLIN SODIUM-DEXTROSE 2-4 GM/100ML-% IV SOLN
2.0000 g | INTRAVENOUS | Status: AC
  Administered 2018-01-24: 2 g via INTRAVENOUS
  Filled 2018-01-24: qty 100

## 2018-01-24 MED ORDER — ROCURONIUM BROMIDE 100 MG/10ML IV SOLN
INTRAVENOUS | Status: DC | PRN
  Administered 2018-01-24: 40 mg via INTRAVENOUS

## 2018-01-24 MED ORDER — MORPHINE SULFATE (PF) 2 MG/ML IV SOLN: 1.0000 mg | INTRAVENOUS | Status: DC | PRN

## 2018-01-24 MED ORDER — GABAPENTIN 300 MG PO CAPS
300.0000 mg | ORAL_CAPSULE | ORAL | Status: AC
  Administered 2018-01-24: 300 mg via ORAL
  Filled 2018-01-24: qty 1

## 2018-01-24 MED ORDER — OXYCODONE HCL 5 MG PO TABS: 5.0000 mg | ORAL_TABLET | ORAL | Status: DC | PRN

## 2018-01-24 MED ORDER — FENTANYL CITRATE (PF) 250 MCG/5ML IJ SOLN
INTRAMUSCULAR | Status: AC
  Filled 2018-01-24: qty 5

## 2018-01-24 MED ORDER — SODIUM CHLORIDE 0.9% FLUSH: 3.0000 mL | INTRAVENOUS | Status: DC | PRN

## 2018-01-24 MED ORDER — HYDROMORPHONE HCL 1 MG/ML IJ SOLN: 0.2500 mg | INTRAMUSCULAR | Status: DC | PRN

## 2018-01-24 MED ORDER — PROPOFOL 10 MG/ML IV BOLUS
INTRAVENOUS | Status: AC
  Filled 2018-01-24: qty 20

## 2018-01-24 MED ORDER — OXYCODONE HCL 5 MG PO TABS
ORAL_TABLET | ORAL | Status: AC
  Filled 2018-01-24: qty 1

## 2018-01-24 MED ORDER — ONDANSETRON HCL 4 MG/2ML IJ SOLN
INTRAMUSCULAR | Status: AC
Start: 2018-01-24 — End: ?
  Filled 2018-01-24: qty 2

## 2018-01-24 MED ORDER — MIDAZOLAM HCL 2 MG/2ML IJ SOLN
INTRAMUSCULAR | Status: AC
  Filled 2018-01-24: qty 2

## 2018-01-24 MED ORDER — ACETAMINOPHEN 325 MG PO TABS: 650.0000 mg | ORAL_TABLET | ORAL | Status: DC | PRN

## 2018-01-24 MED ORDER — MIDAZOLAM HCL 5 MG/5ML IJ SOLN
INTRAMUSCULAR | Status: DC | PRN
  Administered 2018-01-24: 2 mg via INTRAVENOUS

## 2018-01-24 MED ORDER — SUGAMMADEX SODIUM 200 MG/2ML IV SOLN
INTRAVENOUS | Status: DC | PRN
  Administered 2018-01-24: 200 mg via INTRAVENOUS

## 2018-01-24 MED ORDER — FENTANYL CITRATE (PF) 100 MCG/2ML IJ SOLN
INTRAMUSCULAR | Status: DC | PRN
  Administered 2018-01-24: 25 ug via INTRAVENOUS
  Administered 2018-01-24: 100 ug via INTRAVENOUS
  Administered 2018-01-24 (×2): 50 ug via INTRAVENOUS
  Administered 2018-01-24: 25 ug via INTRAVENOUS

## 2018-01-24 MED ORDER — ACETAMINOPHEN 650 MG RE SUPP: 650.0000 mg | RECTAL | Status: DC | PRN

## 2018-01-24 MED ORDER — LIDOCAINE HCL (CARDIAC) 20 MG/ML IV SOLN
INTRAVENOUS | Status: DC | PRN
  Administered 2018-01-24: 100 mg via INTRAVENOUS

## 2018-01-24 MED ORDER — ROCURONIUM BROMIDE 10 MG/ML (PF) SYRINGE
PREFILLED_SYRINGE | INTRAVENOUS | Status: AC
  Filled 2018-01-24: qty 5

## 2018-01-24 MED ORDER — FENTANYL CITRATE (PF) 100 MCG/2ML IJ SOLN
INTRAMUSCULAR | Status: AC
  Filled 2018-01-24: qty 2

## 2018-01-24 MED ORDER — SODIUM CHLORIDE 0.9 % IV SOLN: 250.0000 mL | INTRAVENOUS | Status: DC | PRN

## 2018-01-24 MED ORDER — KETOROLAC TROMETHAMINE 30 MG/ML IJ SOLN: 30.0000 mg | Freq: Once | INTRAMUSCULAR | Status: DC | PRN

## 2018-01-24 MED ORDER — PROPOFOL 10 MG/ML IV BOLUS
INTRAVENOUS | Status: DC | PRN
  Administered 2018-01-24: 200 mg via INTRAVENOUS

## 2018-01-24 MED ORDER — CELECOXIB 200 MG PO CAPS
200.0000 mg | ORAL_CAPSULE | ORAL | Status: AC
  Administered 2018-01-24: 200 mg via ORAL
  Filled 2018-01-24: qty 1

## 2018-01-24 MED ORDER — BUPIVACAINE HCL (PF) 0.25 % IJ SOLN
INTRAMUSCULAR | Status: AC
  Filled 2018-01-24: qty 30

## 2018-01-24 MED ORDER — PROMETHAZINE HCL 25 MG/ML IJ SOLN
6.2500 mg | INTRAMUSCULAR | Status: DC | PRN
Start: 2018-01-24 — End: 2018-01-24

## 2018-01-24 MED ORDER — MEPERIDINE HCL 50 MG/ML IJ SOLN: 6.2500 mg | INTRAMUSCULAR | Status: DC | PRN

## 2018-01-24 MED ORDER — EPHEDRINE SULFATE 50 MG/ML IJ SOLN
INTRAMUSCULAR | Status: DC | PRN
  Administered 2018-01-24: 5 mg via INTRAVENOUS

## 2018-01-24 MED ORDER — 0.9 % SODIUM CHLORIDE (POUR BTL) OPTIME
TOPICAL | Status: DC | PRN
  Administered 2018-01-24: 1000 mL

## 2018-01-24 MED ORDER — OXYCODONE HCL 5 MG PO TABS: 5.0000 mg | ORAL_TABLET | Freq: Four times a day (QID) | ORAL | 0 refills | Status: AC | PRN

## 2018-01-24 MED ORDER — DEXAMETHASONE SODIUM PHOSPHATE 10 MG/ML IJ SOLN
INTRAMUSCULAR | Status: DC | PRN
  Administered 2018-01-24: 10 mg via INTRAVENOUS

## 2018-01-24 MED ORDER — LIDOCAINE HCL (CARDIAC) 20 MG/ML IV SOLN
INTRAVENOUS | Status: AC
  Filled 2018-01-24: qty 5

## 2018-01-24 MED ORDER — SODIUM CHLORIDE 0.9% FLUSH: 3.0000 mL | Freq: Two times a day (BID) | INTRAVENOUS | Status: DC

## 2018-01-24 MED ORDER — BUPIVACAINE HCL (PF) 0.25 % IJ SOLN
INTRAMUSCULAR | Status: DC | PRN
  Administered 2018-01-24: 30 mL

## 2018-01-24 MED ORDER — PHENYLEPHRINE HCL 10 MG/ML IJ SOLN
INTRAMUSCULAR | Status: DC | PRN
  Administered 2018-01-24: 80 ug via INTRAVENOUS

## 2018-01-24 MED ORDER — ONDANSETRON HCL 4 MG/2ML IJ SOLN
INTRAMUSCULAR | Status: DC | PRN
  Administered 2018-01-24: 4 mg via INTRAVENOUS

## 2018-01-24 SURGICAL SUPPLY — 38 items
APPLIER CLIP LOGIC TI 5 (MISCELLANEOUS) IMPLANT
BLADE CLIPPER SURG (BLADE) ×3 IMPLANT
CANISTER SUCT 3000ML PPV (MISCELLANEOUS) IMPLANT
CHLORAPREP W/TINT 26ML (MISCELLANEOUS) ×3 IMPLANT
COVER SURGICAL LIGHT HANDLE (MISCELLANEOUS) ×3 IMPLANT
DERMABOND ADVANCED (GAUZE/BANDAGES/DRESSINGS) ×1
DERMABOND ADVANCED .7 DNX12 (GAUZE/BANDAGES/DRESSINGS) ×2 IMPLANT
DEVICE SECURE STRAP 25 ABSORB (INSTRUMENTS) ×3 IMPLANT
DISSECT BALLN SPACEMKR + OVL (BALLOONS) ×3
DISSECTOR BALLN SPACEMKR + OVL (BALLOONS) ×2 IMPLANT
DISSECTOR BLUNT TIP ENDO 5MM (MISCELLANEOUS) IMPLANT
DRAPE LAPAROSCOPIC ABDOMINAL (DRAPES) IMPLANT
ELECT CAUTERY BLADE 6.4 (BLADE) ×3 IMPLANT
ELECT REM PT RETURN 9FT ADLT (ELECTROSURGICAL) ×3
ELECTRODE REM PT RTRN 9FT ADLT (ELECTROSURGICAL) ×2 IMPLANT
GLOVE SURG SIGNA 7.5 PF LTX (GLOVE) ×3 IMPLANT
GOWN STRL REUS W/ TWL LRG LVL3 (GOWN DISPOSABLE) ×4 IMPLANT
GOWN STRL REUS W/ TWL XL LVL3 (GOWN DISPOSABLE) ×2 IMPLANT
GOWN STRL REUS W/TWL LRG LVL3 (GOWN DISPOSABLE) ×2
GOWN STRL REUS W/TWL XL LVL3 (GOWN DISPOSABLE) ×1
KIT BASIN OR (CUSTOM PROCEDURE TRAY) ×3 IMPLANT
KIT ROOM TURNOVER OR (KITS) ×3 IMPLANT
MESH 3DMAX 4X6 LT LRG (Mesh General) ×3 IMPLANT
MESH 3DMAX 4X6 RT LRG (Mesh General) ×3 IMPLANT
MESH VENTRALEX ST 1-7/10 CRC S (Mesh General) ×3 IMPLANT
NS IRRIG 1000ML POUR BTL (IV SOLUTION) ×3 IMPLANT
PAD ARMBOARD 7.5X6 YLW CONV (MISCELLANEOUS) ×3 IMPLANT
PENCIL BUTTON HOLSTER BLD 10FT (ELECTRODE) ×3 IMPLANT
SET IRRIG TUBING LAPAROSCOPIC (IRRIGATION / IRRIGATOR) IMPLANT
SET TROCAR LAP APPLE-HUNT 5MM (ENDOMECHANICALS) ×3 IMPLANT
SUT MNCRL AB 4-0 PS2 18 (SUTURE) ×3 IMPLANT
SUT NOVA 1 T20/GS 25DT (SUTURE) ×3 IMPLANT
TOWEL OR 17X24 6PK STRL BLUE (TOWEL DISPOSABLE) ×3 IMPLANT
TOWEL OR 17X26 10 PK STRL BLUE (TOWEL DISPOSABLE) ×3 IMPLANT
TRAY FOLEY CATH SILVER 16FR (SET/KITS/TRAYS/PACK) IMPLANT
TRAY LAPAROSCOPIC MC (CUSTOM PROCEDURE TRAY) ×3 IMPLANT
TUBING INSUFFLATION (TUBING) ×3 IMPLANT
WATER STERILE IRR 1000ML POUR (IV SOLUTION) ×3 IMPLANT

## 2018-01-24 NOTE — Anesthesia Postprocedure Evaluation (Signed)
Anesthesia Post Note  Patient: Jeffrey [name]  Procedure(s) Performed: LAPAROSCOPIC BILATERAL INGUINAL HERNIA REPAIR WITH MESH, UMBILICAL HERNIA REPAIR WITH POSSIBLE MESH (Bilateral Groin) INSERTION OF MESH (N/A Groin)     Patient location during evaluation: PACU Anesthesia Type: General Level of consciousness: awake Pain management: pain level controlled Vital Signs Assessment: post-procedure vital signs reviewed and stable Respiratory status: spontaneous breathing Cardiovascular status: stable Postop Assessment: no apparent nausea or vomiting Anesthetic complications: no    Last Vitals:  Vitals:   01/24/18 1027 01/24/18 1030  BP: (!) 132/96   Pulse: 71 63  Resp: 16 16  Temp:    SpO2: 98% 98%    Last Pain:  Vitals:   01/24/18 0958  TempSrc:   PainSc: 0-No pain   Pain Goal: Patients Stated Pain Goal: 3 (01/24/18 0723)               Jeffrey [name],Jeffrey [name]

## 2018-01-24 NOTE — Transfer of Care (Signed)
Immediate Anesthesia Transfer of Care Note  Patient: Jeffrey [name]  Procedure(s) Performed: LAPAROSCOPIC BILATERAL INGUINAL HERNIA REPAIR WITH MESH, UMBILICAL HERNIA REPAIR WITH POSSIBLE MESH (Bilateral Groin) INSERTION OF MESH (N/A Groin)  Patient Location: PACU  Anesthesia Type:General  Level of Consciousness: awake, alert , oriented and patient cooperative  Airway & Oxygen Therapy: Patient Spontanous Breathing  Post-op Assessment: Report given to RN and Post -op Vital signs reviewed and stable  Post vital signs: Reviewed and stable  Last Vitals:  Vitals:   01/24/18 0717 01/24/18 0958  BP: 128/87   Pulse: (!) 59   Resp: 20   Temp: 36.8 C (P) 36.5 C  SpO2: 98%     Last Pain:  Vitals:   01/24/18 0723  TempSrc:   PainSc: 0-No pain      Patients Stated Pain Goal: 3 (01/24/18 0723)  Complications: No apparent anesthesia complications

## 2018-01-24 NOTE — Discharge Instructions (Signed)
[facility] _______[facility] ° °UMBILICAL OR INGUINAL HERNIA REPAIR: POST OP INSTRUCTIONS ° °Always review your discharge instruction sheet given to you by the facility where your surgery was performed. °IF YOU HAVE DISABILITY OR FAMILY LEAVE FORMS, YOU MUST BRING THEM TO THE OFFICE FOR PROCESSING.   °DO NOT GIVE THEM TO YOUR DOCTOR. ° °1. A  prescription for pain medication may be given to you upon discharge.  Take your pain medication as prescribed, if needed.  If narcotic pain medicine is not needed, then you may take acetaminophen (Tylenol) or ibuprofen (Advil) as needed. °2. Take your usually prescribed medications unless otherwise directed. °If you need a refill on your pain medication, please contact your pharmacy.  They will contact our office to request authorization. Prescriptions will not be filled after 5 pm or on week-ends. °3. You should follow a light diet the first 24 hours after arrival home, such as soup and crackers, etc.  Be sure to include lots of fluids daily.  Resume your normal diet the day after surgery. °4.Most patients will experience some swelling and bruising around the umbilicus or in the groin and scrotum.  Ice packs and reclining will help.  Swelling and bruising can take several days to resolve.  °6. It is common to experience some constipation if taking pain medication after surgery.  Increasing fluid intake and taking a stool softener (such as Colace) will usually help or prevent this problem from occurring.  A mild laxative (Milk of Magnesia or Miralax) should be taken according to package directions if there are no bowel movements after 48 hours. °7. Unless discharge instructions indicate otherwise, you may remove your bandages 24-48 hours after surgery, and you may shower at that time.  You may have steri-strips (small skin tapes) in place directly over the incision.  These strips should be left on the skin for 7-10 days.  If your surgeon used skin glue on the  incision, you may shower in 24 hours.  The glue will flake off over the next 2-3 weeks.  Any sutures or staples will be removed at the office during your follow-up visit. °8. ACTIVITIES:  You may resume regular (light) daily activities beginning the next day--such as daily self-care, walking, climbing stairs--gradually increasing activities as tolerated.  You may have sexual intercourse when it is comfortable.  Refrain from any heavy lifting or straining until approved by your doctor. ° °a.You may drive when you are no longer taking prescription pain medication, you can comfortably wear a seatbelt, and you can safely maneuver your car and apply brakes. °b.RETURN TO WORK:   °_____________________________________________ ° °9.You should see your doctor in the office for a follow-up appointment approximately 2-3 weeks after your surgery.  Make sure that you call for this appointment within a day or two after you arrive home to insure a convenient appointment time. °10.OTHER INSTRUCTIONS: _NO LIFTING MORE THAN 15 POUNDS FOR 4 WEEKS °OK TO SHOWER STARTING TOMORROW °ICE PACK, TYLENOL, AND IBUPROFEN ALSO FOR PAIN________________________ °   _____________________________________ ° °WHEN TO CALL YOUR DOCTOR: °1. Fever over 101.0 °2. Inability to urinate °3. Nausea and/or vomiting °4. Extreme swelling or bruising °5. Continued bleeding from incision. °6. Increased pain, redness, or drainage from the incision ° °The clinic staff is available to answer your questions during regular business hours.  Please don’t hesitate to call and ask to speak to one of the nurses for clinical concerns.  If you have a medical emergency, go to the nearest   emergency room or call 911.  A surgeon from [facility] is always on call at the hospital   [address] [place], [place], [place]  [address] ?  [address], [place], [place]   [address] [phone] ? [phone] ? FAX [phone] Web site:  [email or web address]

## 2018-01-24 NOTE — Op Note (Signed)
LAPAROSCOPIC BILATERAL INGUINAL HERNIA REPAIR WITH MESH, UMBILICAL HERNIA REPAIR WITH POSSIBLE MESH, INSERTION OF MESH  Procedure Note  Jeffrey [name] 01/24/2018   Pre-op Diagnosis: LEFT INGUINAL HERNIA, UMBILICAL HERNIA     Post-op Diagnosis: BILATERAL INGUINAL HERNIA, UMBILICAL HERNIA  Procedure(s): LAPAROSCOPIC BILATERAL INGUINAL HERNIA REPAIR WITH MESH, UMBILICAL HERNIA REPAIR WITH  MESH INSERTION OF MESH  Surgeon(s): Abigail Miyamoto, MD  Anesthesia: General  Staff:  Circulator: Tenna Child, RN Scrub Person: Sofie Rower, RN; Melina Modena M Circulator Assistant: Jolinda Croak, RN  Estimated Blood Loss: Minimal               Findings: The patient was found to have bilateral indirect inguinal hernias with the left being larger than the right.  These were both repaired with Bard Prolene 3 DMax large mesh.  There was also an umbilical hernia which was repaired with a 4.3 cm round ventral patch from Bard  Procedure: The patient was brought to the operating room and identified as the correct patient.  He was placed supine on the operating room table and general anesthesia was induced.  His abdomen was prepped and draped in the usual sterile fashion.  I made a small vertical incision just below the umbilicus with a scalpel.  I carried this down to the fascia which was opened just to the left of the midline.  The rectus muscle was then manipulated and elevated.  The dissecting balloon was passed underneath the rectus muscle and manipulate it toward the pubis.  The dissecting balloon was then insufflated under direct vision dissecting the preperitoneal space.  The balloon was then removed and insufflation was begun with carbon dioxide.  Two 5 mm trochars were then placed in the patient's lower midline both under direct vision.  The patient had a large indirect left inguinal hernia sac which I was able to reduce from the cord structures.  He had a smaller right-sided indirect hernia  sac which I was able to reduce as well.  I next brought 2 separate pieces of Bard 3 DMax large Prolene mesh onto the field.  I placed each piece on the inguinal floors on the respective sides and then tacked in place with absorbable tacker to Cooper's ligament, up the medial abdominal wall, and slightly laterally.  Wide coverage of the inguinal area appear to be achieved on both sides.  Hemostasis also appeared to be achieved.  At this point the midline trochars were removed and the preperitoneal space was seen to collapse appropriately under direct vision.  I then removed the trocar at the umbilicus.  I next separated the umbilical hernia sac from the overlying umbilical skin with the cautery.  I then excised the sac and reduced all contents back in the abdominal cavity.  I 4.3 cm piece of Bard ventral mesh was used.  I placed it through the fascial opening and pulled it up against the peritoneum with the stay ties.  I then sewed the mesh in circumferentially with #1 Novafil sutures.  We cut the stay ties and closed the fascia over the top of the mesh with a 0 Vicryl suture.  I then closed the small fascial defect from the laparoscopic procedure with a figure-of-eight 0 Vicryl suture as well.  All incisions were anesthetized with Marcaine.  I performed bilateral ilioinguinal nerve blocks Marcaine as well.  All skin incisions were then closed with 4-0 Monocryl.  Dermabond was then applied.  The patient tolerated the procedure well.  All the  counts were correct at the end of the procedure.  The patient was then extubated in the operating room and taken in a stable condition to the recovery room.          Anis Cinelli A   Date: 01/24/2018  Time: 9:56 AM

## 2018-01-24 NOTE — Anesthesia Preprocedure Evaluation (Signed)
Anesthesia Evaluation  Patient identified by MRN, date of birth, ID band Patient awake    Reviewed: Allergy & Precautions, NPO status , Patient's Chart, lab work & pertinent test results  Airway Mallampati: I       Dental no notable dental hx. (+) Teeth Intact   Pulmonary neg pulmonary ROS,    Pulmonary exam normal breath sounds clear to auscultation       Cardiovascular negative cardio ROS   Rhythm:Regular Rate:Normal     Neuro/Psych negative neurological ROS  negative psych ROS   GI/Hepatic   Endo/Other  negative endocrine ROS  Renal/GU   negative genitourinary   Musculoskeletal negative musculoskeletal ROS (+)   Abdominal Normal abdominal exam  (+)   Peds  Hematology negative hematology ROS (+)   Anesthesia Other Findings   Reproductive/Obstetrics                             Anesthesia Physical Anesthesia Plan  ASA: II  Anesthesia Plan: General   Post-op Pain Management:    Induction: Intravenous  PONV Risk Score and Plan: 4 or greater and Ondansetron, Dexamethasone and Midazolam  Airway Management Planned: Oral ETT  Additional Equipment:   Intra-op Plan:   Post-operative Plan: Extubation in OR  Informed Consent: I have reviewed the patients History and Physical, chart, labs and discussed the procedure including the risks, benefits and alternatives for the proposed anesthesia with the patient or authorized representative who has indicated his/her understanding and acceptance.   Dental advisory given  Plan Discussed with: CRNA and Surgeon  Anesthesia Plan Comments:         Anesthesia Quick Evaluation

## 2018-01-24 NOTE — Interval H&P Note (Signed)
History and Physical Interval Note:no change in H and P  01/24/2018 7:12 AM  Jeffrey LeitzJeff [name]  has presented today for surgery, with the diagnosis of LEFT INGUINAL HERNIA, UMBILIAL HERNIA  The various methods of treatment have been discussed with the patient and family. After consideration of risks, benefits and other options for treatment, the patient has consented to  Procedure(s): LAPAROSCOCPIC LEFT POSSIBLE RIGHT INGUINAL HERNIA REPAIR WITH MESH, UMBILICAL HERNIA REPAIR WITH POSSIBLE MESH (N/[name]) INSERTION OF MESH (N/[name]) as [name] surgical intervention .  The patient's history has been reviewed, patient examined, no change in status, stable for surgery.  I have reviewed the patient's chart and labs.  Questions were answered to the patient's satisfaction.     Jeffrey [name]

## 2018-01-24 NOTE — Anesthesia Procedure Notes (Signed)
Procedure Name: Intubation Date/Time: 01/24/2018 8:51 AM Performed by: Shirlyn Goltz, CRNA Pre-anesthesia Checklist: Emergency Drugs available, Patient identified, Suction available and Patient being monitored Patient Re-evaluated:Patient Re-evaluated prior to induction Oxygen Delivery Method: Circle system utilized Preoxygenation: Pre-oxygenation with 100% oxygen Induction Type: IV induction Ventilation: Mask ventilation without difficulty and Oral airway inserted - appropriate to patient size Laryngoscope Size: Mac and 3 Grade View: Grade I Tube type: Oral Tube size: 7.0 mm Number of attempts: 1 Airway Equipment and Method: Stylet Placement Confirmation: ETT inserted through vocal cords under direct vision,  positive ETCO2 and breath sounds checked- equal and bilateral Secured at: 21 cm Tube secured with: Tape Dental Injury: Teeth and Oropharynx as per pre-operative assessment

## 2018-01-25 ENCOUNTER — Encounter (HOSPITAL_COMMUNITY): Payer: Self-pay | Admitting: Surgery

## 2019-10-27 IMAGING — CT CT ABD-PELV W/ CM
2 of 5 series · 15 of 46 positions shown, 17 images · IV contrast (APPLIED)
Comparison: None.

CLINICAL DATA: The patient was lifting heavy object and felt a pop
in his left groin.

EXAM:
CT ABDOMEN AND PELVIS WITH CONTRAST
TECHNIQUE: Multidetector CT imaging of the abdomen and pelvis was performed
using the standard protocol following bolus administration of
intravenous contrast.
CONTRAST:  100mL [id number] IOPAMIDOL ([id number]) INJECTION 61%

[Series 3: abd/ pelvis 5.0 i30f 2 · axial · 0.70mm/px · z∈[+741,+1216]mm · 12 of 107 slices shown, 14 images]
[im 6/107  soft-tissue]
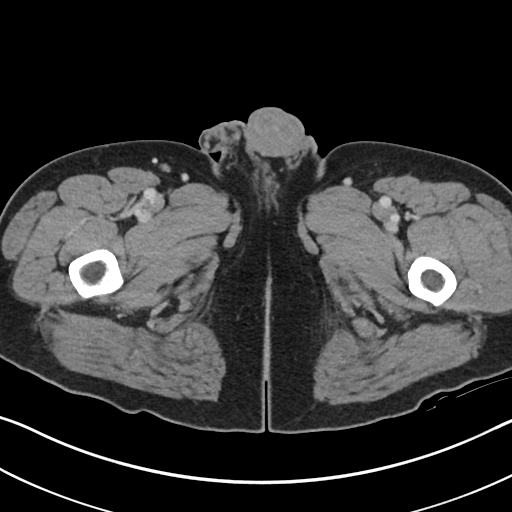
[im 6/107  bone]
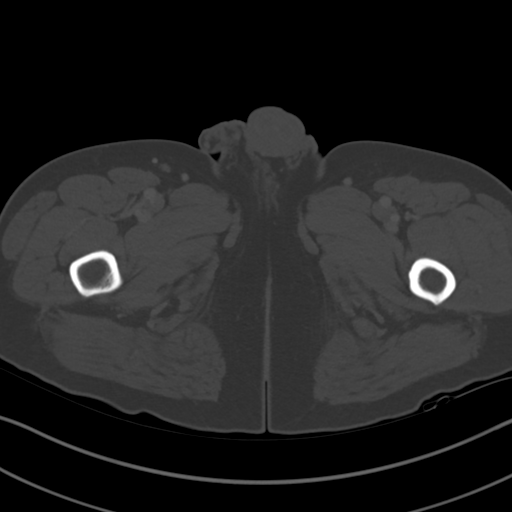
[im 17/107  soft-tissue]
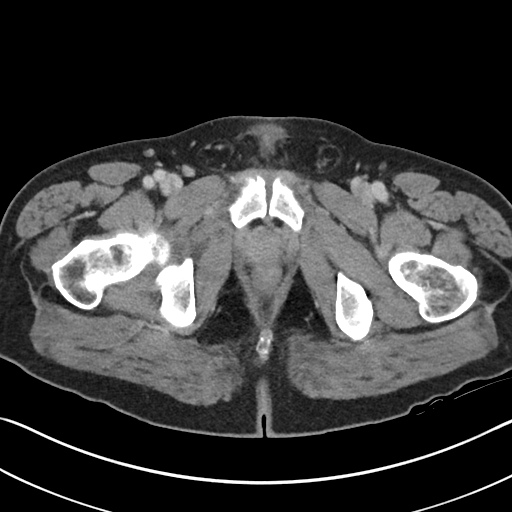
[im 23/107  soft-tissue]
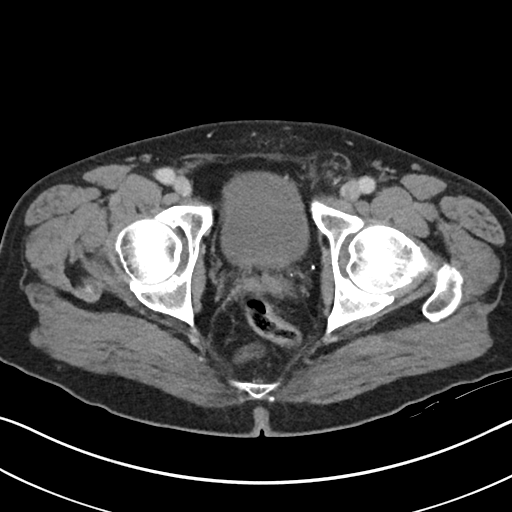
[im 34/107  soft-tissue]
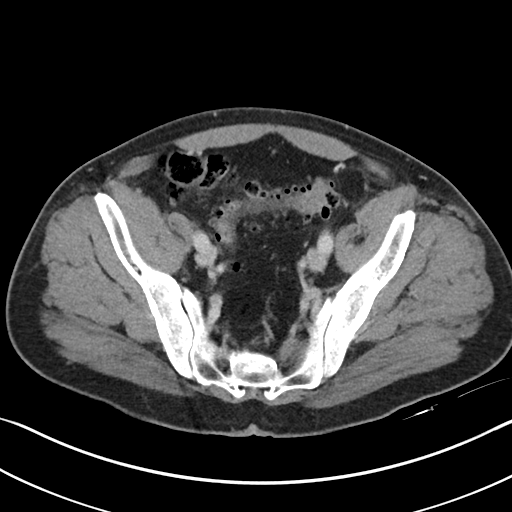
[im 40/107  soft-tissue]
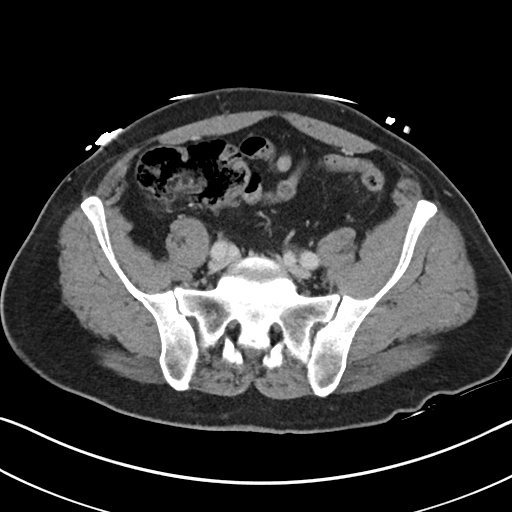
[im 51/107  soft-tissue]
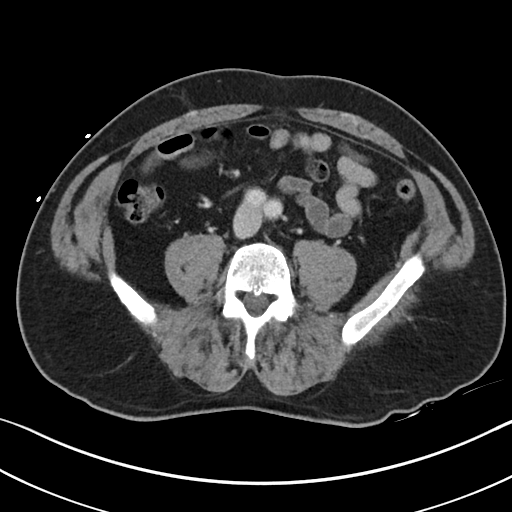
[im 56/107  soft-tissue]
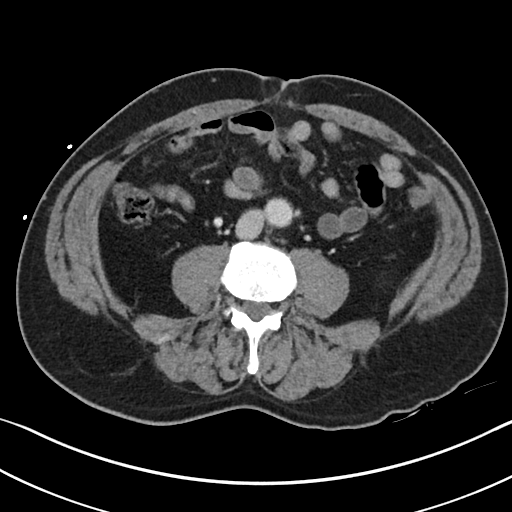
[im 67/107  soft-tissue]
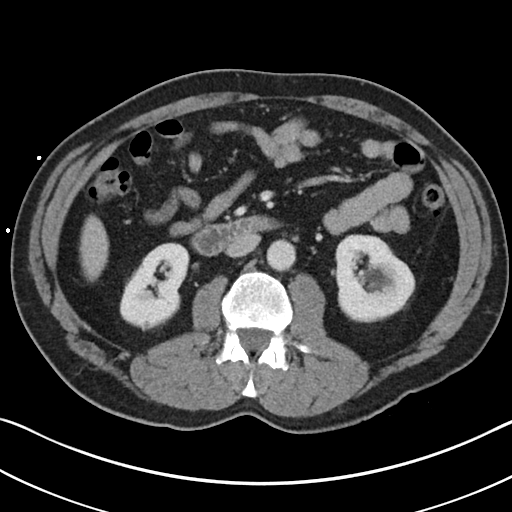
[im 73/107  soft-tissue]
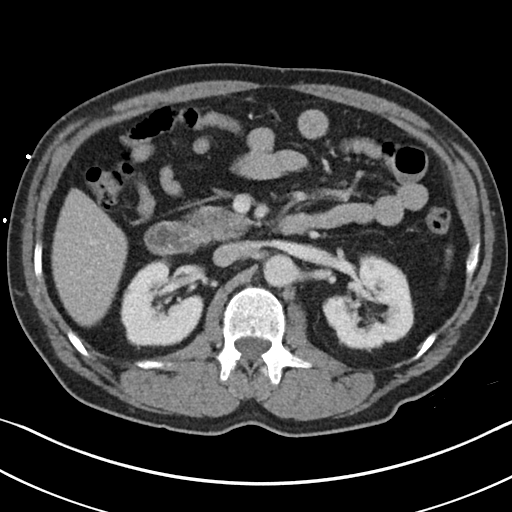
[im 73/107  bone]
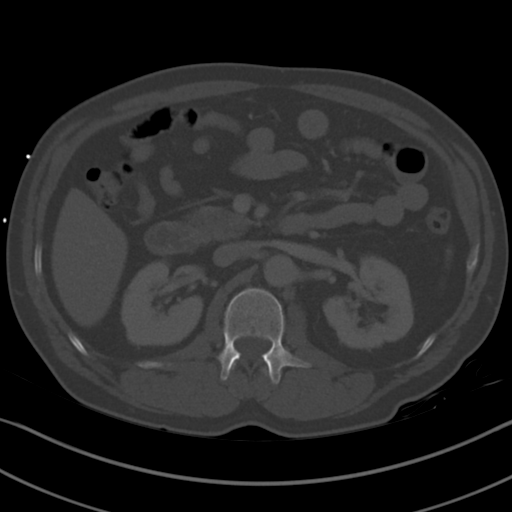
[im 84/107  soft-tissue]
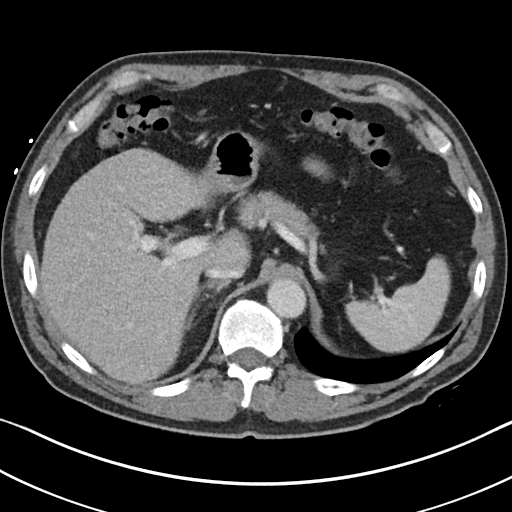
[im 90/107  soft-tissue]
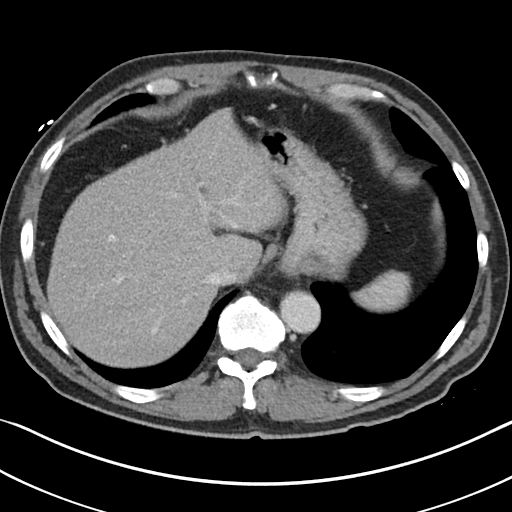
[im 101/107  soft-tissue]
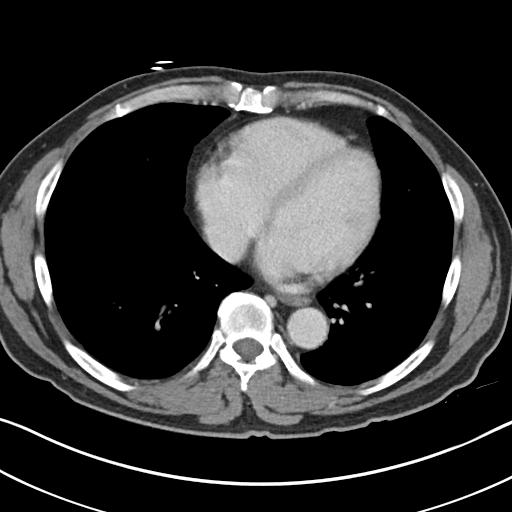

[Series 6: coronal soft tissue · coronal · 0.73mm/px · 3 of 78 slices shown]
[im 26/78  soft-tissue]
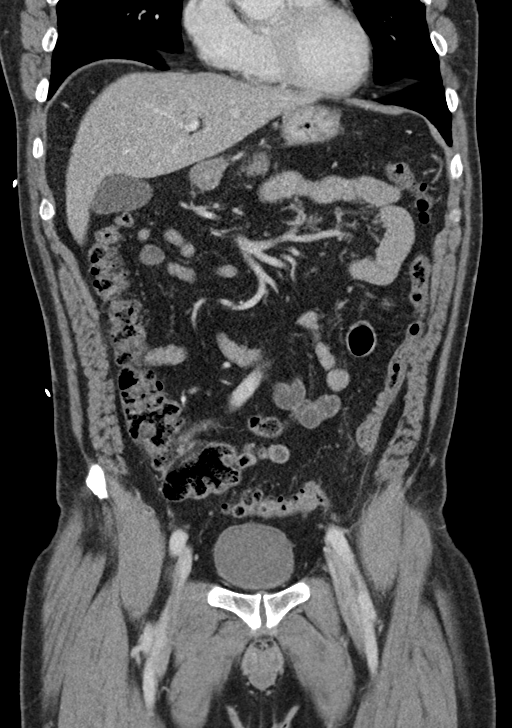
[im 35/78  soft-tissue]
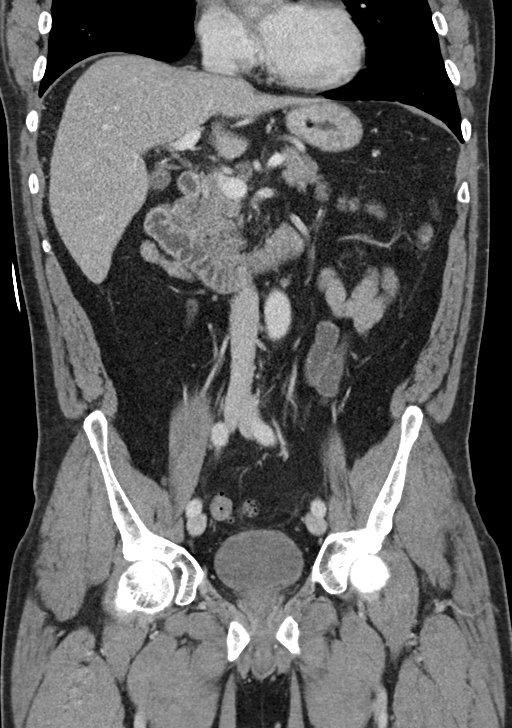
[im 43/78  soft-tissue]
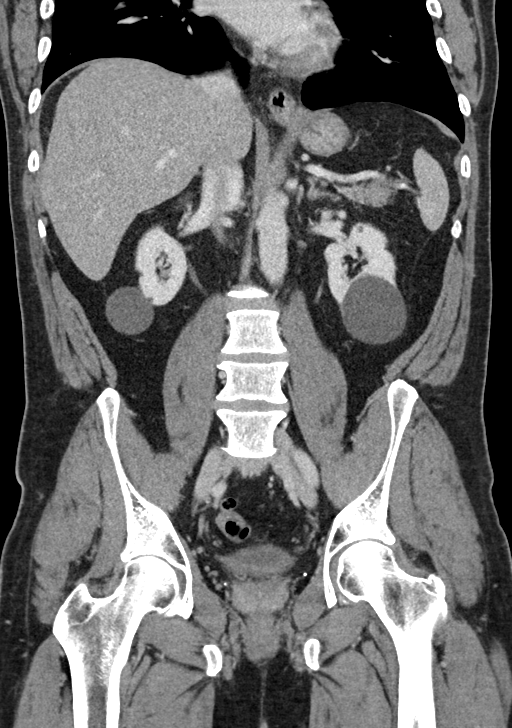

[15 of 46 positions shown; findings below may reference images not displayed]

FINDINGS: Lower chest: No acute abnormality.

Hepatobiliary: Mild hepatic steatosis. No liver masses. The
gallbladder and portal vein are normal.

Pancreas: Subtle low-attenuation in the head of the pancreas is non
mass like and appears to represent focal fatty deposition. No focal
mass identified.

Spleen: Normal in size without focal abnormality.

Adrenals/Urinary Tract: Bilateral renal cysts are identified. No
hydronephrosis, perinephric stranding, ureterectasis, or ureteral
stone. The bladder is normal.

Stomach/Bowel: The stomach and small bowel are normal. Scattered
colonic diverticuli are identified without diverticulitis. The colon
is otherwise normal. The appendix is normal.

Vascular/Lymphatic: No significant vascular findings are present. No
enlarged abdominal or pelvic lymph nodes.

Reproductive: Prostate is unremarkable.

Other: There is a periumbilical fat containing hernia. There is a
small left inguinal hernia. There is mild increased attenuation in
the fat adjacent to the origin of the inguinal canal, asymmetric to
the right, as seen on axial image 86 and coronal image 23. No
muscular avulsion identified. No hematoma.

Musculoskeletal: No acute or significant osseous findings.
IMPRESSION: 1. There is a small left inguinal hernia which is may be chronic.
There is slight increased attenuation of fat of the inguinal region,
asymmetric on the left compared to the right. It is possible this is
normal for this patient. However, this could be a subtle sign of
soft tissue injury. No other significant abnormalities.
